# Patient Record
Sex: Male | Born: 1970 | Race: Black or African American | Hispanic: No | Marital: Married | Smoking: Never smoker
Health system: Southern US, Community
[De-identification: ages and names within clinical notes are randomized; demographics above are authoritative.]

## PROBLEM LIST (undated history)

## (undated) DIAGNOSIS — K5792 Diverticulitis of intestine, part unspecified, without perforation or abscess without bleeding: Secondary | ICD-10-CM

## (undated) DIAGNOSIS — I10 Essential (primary) hypertension: Secondary | ICD-10-CM

## (undated) DIAGNOSIS — G43709 Chronic migraine without aura, not intractable, without status migrainosus: Secondary | ICD-10-CM

## (undated) DIAGNOSIS — R011 Cardiac murmur, unspecified: Secondary | ICD-10-CM

## (undated) DIAGNOSIS — G4730 Sleep apnea, unspecified: Secondary | ICD-10-CM

## (undated) DIAGNOSIS — IMO0002 Reserved for concepts with insufficient information to code with codable children: Secondary | ICD-10-CM

## (undated) DIAGNOSIS — E119 Type 2 diabetes mellitus without complications: Secondary | ICD-10-CM

## (undated) DIAGNOSIS — K219 Gastro-esophageal reflux disease without esophagitis: Secondary | ICD-10-CM

## (undated) HISTORY — DX: Reserved for concepts with insufficient information to code with codable children: IMO0002

## (undated) HISTORY — PX: APPENDECTOMY: SHX54

## (undated) HISTORY — DX: Cardiac murmur, unspecified: R01.1

## (undated) HISTORY — DX: Chronic migraine without aura, not intractable, without status migrainosus: G43.709

## (undated) HISTORY — DX: Gastro-esophageal reflux disease without esophagitis: K21.9

---

## 2000-10-12 HISTORY — PX: VASECTOMY: SHX75

## 2005-07-06 ENCOUNTER — Emergency Department: Payer: Self-pay | Admitting: Emergency Medicine

## 2012-08-26 ENCOUNTER — Ambulatory Visit: Payer: Self-pay | Admitting: Surgery

## 2012-08-26 LAB — BASIC METABOLIC PANEL
Anion Gap: 7 (ref 7–16)
BUN: 15 mg/dL (ref 7–18)
Calcium, Total: 9.2 mg/dL (ref 8.5–10.1)
Creatinine: 0.86 mg/dL (ref 0.60–1.30)
EGFR (African American): >60
EGFR (Non-African Amer.): >60
Glucose: 96 mg/dL (ref 65–99)
Osmolality: 275 (ref 275–301)
Potassium: 3.5 mmol/L (ref 3.5–5.1)

## 2012-08-26 LAB — CBC WITH DIFFERENTIAL/PLATELET
Basophil %: 0.5 %
Eosinophil %: 0.7 %
HCT: 39.9 % — ABNORMAL LOW (ref 40.0–52.0)
Lymphocyte #: 2.3 10*3/uL (ref 1.0–3.6)
MCH: 31.2 pg (ref 26.0–34.0)
MCHC: 34.8 g/dL (ref 32.0–36.0)
MCV: 90 fL (ref 80–100)
Monocyte %: 9 %
Neutrophil #: 8.7 10*3/uL — ABNORMAL HIGH (ref 1.4–6.5)
Platelet: 263 10*3/uL (ref 150–440)
RBC: 4.45 10*6/uL (ref 4.40–5.90)
WBC: 12.2 10*3/uL — ABNORMAL HIGH (ref 3.8–10.6)

## 2013-02-24 ENCOUNTER — Ambulatory Visit: Payer: Self-pay | Admitting: Nurse Practitioner

## 2013-05-16 ENCOUNTER — Emergency Department: Payer: Self-pay | Admitting: Emergency Medicine

## 2013-05-16 LAB — CBC
HCT: 40 % (ref 40.0–52.0)
MCHC: 34.7 g/dL (ref 32.0–36.0)
MCV: 88 fL (ref 80–100)
Platelet: 224 10*3/uL (ref 150–440)
RBC: 4.54 10*6/uL (ref 4.40–5.90)
RDW: 13.8 % (ref 11.5–14.5)

## 2013-05-16 LAB — BASIC METABOLIC PANEL
BUN: 14 mg/dL (ref 7–18)
Chloride: 103 mmol/L (ref 98–107)
Co2: 26 mmol/L (ref 21–32)
EGFR (African American): >60
EGFR (Non-African Amer.): >60
Osmolality: 275 (ref 275–301)
Potassium: 3.5 mmol/L (ref 3.5–5.1)
Sodium: 137 mmol/L (ref 136–145)

## 2013-05-16 LAB — TROPONIN I: Troponin-I: <0.02 ng/mL

## 2013-12-06 ENCOUNTER — Ambulatory Visit: Payer: BC Managed Care – PPO | Admitting: Nurse Practitioner

## 2014-03-09 ENCOUNTER — Ambulatory Visit: Payer: BC Managed Care – PPO | Admitting: Neurology

## 2014-03-09 ENCOUNTER — Telehealth: Payer: Self-pay | Admitting: Neurology

## 2014-03-09 NOTE — Telephone Encounter (Signed)
Called and left message for patient about rescheduling today's appointment with Dr. Terrace Arabia that he missed due to getting lost.

## 2014-04-19 ENCOUNTER — Emergency Department: Payer: Self-pay | Admitting: Emergency Medicine

## 2014-04-19 LAB — COMPREHENSIVE METABOLIC PANEL
AST: 27 U/L (ref 15–37)
Albumin: 3.9 g/dL (ref 3.4–5.0)
Alkaline Phosphatase: 68 U/L
Anion Gap: 9 (ref 7–16)
BUN: 17 mg/dL (ref 7–18)
Bilirubin,Total: 0.6 mg/dL (ref 0.2–1.0)
Calcium, Total: 8.9 mg/dL (ref 8.5–10.1)
Chloride: 105 mmol/L (ref 98–107)
Co2: 27 mmol/L (ref 21–32)
Creatinine: 0.9 mg/dL (ref 0.60–1.30)
EGFR (African American): >60
EGFR (Non-African Amer.): >60
Glucose: 103 mg/dL — ABNORMAL HIGH (ref 65–99)
OSMOLALITY: 283 (ref 275–301)
Potassium: 3.7 mmol/L (ref 3.5–5.1)
SGPT (ALT): 40 U/L (ref 12–78)
SODIUM: 141 mmol/L (ref 136–145)
TOTAL PROTEIN: 7.5 g/dL (ref 6.4–8.2)

## 2014-04-19 LAB — CBC
HCT: 41.9 % (ref 40.0–52.0)
HGB: 14 g/dL (ref 13.0–18.0)
MCH: 30.8 pg (ref 26.0–34.0)
MCHC: 33.5 g/dL (ref 32.0–36.0)
MCV: 92 fL (ref 80–100)
PLATELETS: 251 10*3/uL (ref 150–440)
RBC: 4.55 10*6/uL (ref 4.40–5.90)
RDW: 13.2 % (ref 11.5–14.5)
WBC: 7 10*3/uL (ref 3.8–10.6)

## 2014-04-19 LAB — URINALYSIS, COMPLETE
BACTERIA: NONE SEEN
BILIRUBIN, UR: NEGATIVE
BLOOD: NEGATIVE
Glucose,UR: NEGATIVE mg/dL (ref 0–75)
KETONE: NEGATIVE
Leukocyte Esterase: NEGATIVE
Nitrite: NEGATIVE
PROTEIN: NEGATIVE
Ph: 5 (ref 4.5–8.0)
RBC,UR: 2 /HPF (ref 0–5)
Specific Gravity: 1.026 (ref 1.003–1.030)
Squamous Epithelial: NONE SEEN
WBC UR: 1 /HPF (ref 0–5)

## 2014-04-19 LAB — CLOSTRIDIUM DIFFICILE(ARMC)

## 2014-04-19 LAB — LIPASE, BLOOD: Lipase: 209 U/L (ref 73–393)

## 2014-11-28 ENCOUNTER — Emergency Department: Payer: Self-pay | Admitting: Emergency Medicine

## 2014-12-13 ENCOUNTER — Ambulatory Visit: Payer: Self-pay | Admitting: Gastroenterology

## 2014-12-13 HISTORY — PX: COLONOSCOPY: SHX174

## 2015-01-29 NOTE — Op Note (Signed)
PATIENT NAME:  Tyler Colon, Tyler Colon MR#:  [id number] DATE OF BIRTH:  09/05/1971  DATE OF PROCEDURE:  08/26/2012  PREOPERATIVE DIAGNOSIS: Perirectal abscess.   POSTOPERATIVE DIAGNOSIS: Perirectal abscess.   PROCEDURE: Incision and drainage of perirectal abscess.   SURGEON: Dionne Miloichard Nimco Bivens, MD   ANESTHESIA: Spinal.   INDICATIONS: This is a patient with perirectal abscess seen by Dr. Egbert GaribaldiBird in the office yesterday. He is here for elective incision and drainage.   FINDINGS: Small abscess cavity on the right buttock . A Penrose was drain placed.   DESCRIPTION OF PROCEDURE: The patient was induced to spinal anesthesia with an adequate level of anesthesia, and then he was placed in a high lithotomy position with candy cane stirrups, prepped and draped in a sterile fashion. An incision was made on the right buttock at the area of maximum fluctuance and purulence exuded. Cultures were taken. The cavity was probed and found to be quite shallow. A Penrose drain was placed and tied in with 2-0 Prolene and trimmed appropriately. A sterile dressing was placed.   The patient tolerated the procedure well. There were no complications. He was taken to the recovery room in stable condition to be admitted for continued care.   ____________________________ Adah Salvageichard E. Excell Seltzerooper, MD rec:cbb D: 08/26/2012 09:35:37 ET T: 08/26/2012 10:32:33 ET JOB#: [id number]  cc: Adah Salvageichard E. Excell Seltzerooper, MD, <Dictator> Lattie HawICHARD E Jennessy Sandridge MD ELECTRONICALLY SIGNED 08/26/2012 14:34

## 2015-01-29 NOTE — Discharge Summary (Signed)
PATIENT NAME:  Tyler Colon, Tyler Colon MR#:  [id number] DATE OF BIRTH:  December 22, 1970  DATE OF ADMISSION:  08/26/2012 DATE OF DISCHARGE:  08/27/2012  DISCHARGE DIAGNOSIS: Perirectal abscess.   PROCEDURE: Incision and drainage of perirectal abscess.   HISTORY OF PRESENT ILLNESS/HOSPITAL COURSE: This is a patient who was seen in the office by Dr. Egbert GaribaldiBird and scheduled for elective incision and drainage of a perirectal abscess which was performed by me on Friday, the 15th. A Penrose drain was placed. Cultures are pending. He is discharged in stable condition on Keflex and Vicodin to follow up in my office on Monday or Tuesday for Penrose drain removal. He understood all this and agrees with the plan.   ____________________________ Adah Salvageichard E. Excell Seltzerooper, MD rec:cbb D: 08/27/2012 07:55:47 ET T: 08/29/2012 10:27:52 ET JOB#: [id number]  cc: Adah Salvageichard E. Excell Seltzerooper, MD, <Dictator> Lattie HawICHARD E COOPER MD ELECTRONICALLY SIGNED 09/01/2012 13:06

## 2015-04-02 ENCOUNTER — Ambulatory Visit: Payer: Self-pay | Admitting: Family Medicine

## 2015-04-16 DIAGNOSIS — E785 Hyperlipidemia, unspecified: Secondary | ICD-10-CM | POA: Insufficient documentation

## 2015-04-16 DIAGNOSIS — Z0000 Encounter for general adult medical examination without abnormal findings: Secondary | ICD-10-CM | POA: Insufficient documentation

## 2015-04-16 DIAGNOSIS — L03317 Cellulitis of buttock: Secondary | ICD-10-CM | POA: Insufficient documentation

## 2015-04-16 DIAGNOSIS — K611 Rectal abscess: Secondary | ICD-10-CM | POA: Insufficient documentation

## 2015-04-16 DIAGNOSIS — K1120 Sialoadenitis, unspecified: Secondary | ICD-10-CM | POA: Insufficient documentation

## 2015-04-16 DIAGNOSIS — R011 Cardiac murmur, unspecified: Secondary | ICD-10-CM | POA: Insufficient documentation

## 2015-04-16 DIAGNOSIS — R739 Hyperglycemia, unspecified: Secondary | ICD-10-CM | POA: Insufficient documentation

## 2015-04-16 DIAGNOSIS — Z125 Encounter for screening for malignant neoplasm of prostate: Secondary | ICD-10-CM | POA: Insufficient documentation

## 2015-04-16 DIAGNOSIS — R972 Elevated prostate specific antigen [PSA]: Secondary | ICD-10-CM | POA: Insufficient documentation

## 2015-04-16 DIAGNOSIS — H9209 Otalgia, unspecified ear: Secondary | ICD-10-CM | POA: Insufficient documentation

## 2015-04-16 DIAGNOSIS — R945 Abnormal results of liver function studies: Secondary | ICD-10-CM | POA: Insufficient documentation

## 2015-04-16 DIAGNOSIS — R5383 Other fatigue: Secondary | ICD-10-CM | POA: Insufficient documentation

## 2015-04-16 DIAGNOSIS — R109 Unspecified abdominal pain: Secondary | ICD-10-CM | POA: Insufficient documentation

## 2015-04-16 DIAGNOSIS — M549 Dorsalgia, unspecified: Secondary | ICD-10-CM | POA: Insufficient documentation

## 2015-04-16 DIAGNOSIS — G43109 Migraine with aura, not intractable, without status migrainosus: Secondary | ICD-10-CM | POA: Insufficient documentation

## 2015-04-16 DIAGNOSIS — R7989 Other specified abnormal findings of blood chemistry: Secondary | ICD-10-CM | POA: Insufficient documentation

## 2015-04-16 DIAGNOSIS — E291 Testicular hypofunction: Secondary | ICD-10-CM | POA: Insufficient documentation

## 2015-04-16 DIAGNOSIS — G4700 Insomnia, unspecified: Secondary | ICD-10-CM | POA: Insufficient documentation

## 2015-04-16 DIAGNOSIS — Z833 Family history of diabetes mellitus: Secondary | ICD-10-CM | POA: Insufficient documentation

## 2015-04-18 ENCOUNTER — Encounter: Payer: Self-pay | Admitting: Family Medicine

## 2015-04-18 ENCOUNTER — Ambulatory Visit (INDEPENDENT_AMBULATORY_CARE_PROVIDER_SITE_OTHER): Payer: BLUE CROSS/BLUE SHIELD | Admitting: Family Medicine

## 2015-04-18 VITALS — BP 140/84 | HR 82 | Wt 189.6 lb

## 2015-04-18 DIAGNOSIS — G43409 Hemiplegic migraine, not intractable, without status migrainosus: Secondary | ICD-10-CM | POA: Diagnosis not present

## 2015-04-18 DIAGNOSIS — H6121 Impacted cerumen, right ear: Secondary | ICD-10-CM | POA: Insufficient documentation

## 2015-04-18 DIAGNOSIS — R972 Elevated prostate specific antigen [PSA]: Secondary | ICD-10-CM

## 2015-04-18 DIAGNOSIS — R7309 Other abnormal glucose: Secondary | ICD-10-CM | POA: Diagnosis not present

## 2015-04-18 MED ORDER — SUMATRIPTAN SUCCINATE 100 MG PO TABS: 100.0000 mg | ORAL_TABLET | Freq: Every day | ORAL | Status: DC | PRN

## 2015-04-18 MED ORDER — SUMATRIPTAN SUCCINATE 25 MG PO TABS: 25.0000 mg | ORAL_TABLET | ORAL | Status: DC | PRN

## 2015-04-18 NOTE — Progress Notes (Signed)
Name: Tyler Colon   MRN: [id number]    DOB: 01-06-1971   Date:04/18/2015       Progress Note  Subjective  Chief Complaint  Chief Complaint  Patient presents with  . Establish Care  . Medication Management    Migraine  This is a chronic problem. The current episode started more than 1 year ago. Episode frequency: twice a week on average. The pain is located in the left unilateral region. The pain radiates to the face. The pain quality is similar to prior headaches. The quality of the pain is described as sharp. The pain is at a severity of 3/10. The pain is mild. Associated symptoms include dizziness, ear pain, eye pain, photophobia, a visual change and vomiting. Pertinent negatives include no phonophobia. The symptoms are aggravated by bright light. He has tried triptans for the symptoms. The treatment provided moderate relief. His past medical history is significant for migraine headaches and migraines in the family. There is no history of recent head traumas or sinus disease.     Past Medical History  Diagnosis Date  . Heart murmur   . Chronic migraine   . GERD (gastroesophageal reflux disease)     Past Surgical History  Procedure Laterality Date  . Vasectomy  2002  . Appendectomy      Family History  Problem Relation Age of Onset  . Hypertension Mother   . Diabetes Mother   . Cancer Father     Lung cancer  . Diabetes Father   . Heart failure Father   . Hypertension Maternal Aunt   . Cancer Maternal Aunt     Breast Cancer  . Hypertension Maternal Grandmother   . Diabetes Maternal Grandmother   . Cancer Maternal Grandfather     Lung Cancer    History   Social History  . Marital Status: Married    Spouse Name: N/A  . Number of Children: 2  . Years of Education: N/A   Occupational History  . Employed/ Full time [employer]   Social History Main Topics  . Smoking status: Never Smoker   . Smokeless tobacco: Never Used  . Alcohol Use: 0.0 oz/week    0 Standard drinks  or equivalent per week     Comment: Occasional alcohol use  . Drug Use: No  . Sexual Activity: Not on file   Other Topics Concern  . Not on file   Social History Narrative     Current outpatient prescriptions:  .  CIALIS 5 MG tablet, Take 5 mg by mouth daily., Disp: , Rfl: 0 .  omeprazole (PRILOSEC) 10 MG capsule, Take by mouth., Disp: , Rfl:  .  SUMAtriptan (IMITREX) 100 MG tablet, Take 1 tablet (100 mg total) by mouth daily as needed for migraine. May repeat in 2 hours if headache persists or recurs., Disp: 30 tablet, Rfl: 0  No Known Allergies   Review of Systems  HENT: Positive for ear pain.   Eyes: Positive for photophobia and pain.  Gastrointestinal: Positive for vomiting.  Neurological: Positive for dizziness.      Objective  Filed Vitals:   04/18/15 0807  BP: 140/84  Pulse: 82  Weight: 189 lb 9.6 oz (86.002 kg)  SpO2: 97%    Physical Exam  Constitutional: He is well-developed, well-nourished, and in no distress.  HENT:  Head: Normocephalic and atraumatic.  Right Ear: External ear normal.  Left Ear: External ear normal.  Cerumen impaction right ear canal  Eyes: Conjunctivae are normal. Pupils are  equal, round, and reactive to light.  Neck: Normal range of motion. Neck supple.  Cardiovascular: Normal rate and regular rhythm.   Pulmonary/Chest: Effort normal and breath sounds normal.  Nursing note and vitals reviewed.      No results found for this or any previous visit (from the past 2160 hour(s)).   Assessment & Plan 1. Hemiplegic migraine without status migrainosus, not intractable Chronic migraine, responsive to sumatriptan. Refill provided. Patient will follow-up. - SUMAtriptan (IMITREX) 100 MG tablet; Take 1 tablet (100 mg total) by mouth daily as needed for migraine. May repeat in 2 hours if headache persists or recurs.  Dispense: 30 tablet; Refill: 0  2. Elevated PSA Patient was referred to urology by Dr. Marguerite OleaMoffett but never got an  appointment. Last PSA in March 2016 was elevated at 4.5. Patient will be referred to Dr. Vanna ScotlandAshley Brandon for follow-up - Ambulatory referral to Urology  3. Elevated hemoglobin A1c measurement Last A1c was 6.1% in March 2016. We will repeat today. - HgB A1c  4. Right ear impacted cerumen  - Ear Lavage   Branden Shallenberger Asad A. Faylene [facility] 04/18/2015 9:21 AM

## 2015-05-02 ENCOUNTER — Encounter: Payer: Self-pay | Admitting: Family Medicine

## 2015-05-02 ENCOUNTER — Ambulatory Visit (INDEPENDENT_AMBULATORY_CARE_PROVIDER_SITE_OTHER): Payer: BLUE CROSS/BLUE SHIELD | Admitting: Family Medicine

## 2015-05-02 ENCOUNTER — Telehealth: Payer: Self-pay | Admitting: Family Medicine

## 2015-05-02 VITALS — BP 140/77 | HR 76 | Temp 98.1°F | Resp 18 | Ht 67.0 in | Wt 193.4 lb

## 2015-05-02 DIAGNOSIS — R197 Diarrhea, unspecified: Secondary | ICD-10-CM | POA: Insufficient documentation

## 2015-05-02 DIAGNOSIS — K529 Noninfective gastroenteritis and colitis, unspecified: Secondary | ICD-10-CM | POA: Diagnosis not present

## 2015-05-02 MED ORDER — METRONIDAZOLE 500 MG PO TABS: 500.0000 mg | ORAL_TABLET | Freq: Three times a day (TID) | ORAL | Status: DC

## 2015-05-02 MED ORDER — CIPROFLOXACIN HCL 500 MG PO TABS: 500.0000 mg | ORAL_TABLET | Freq: Two times a day (BID) | ORAL | Status: DC

## 2015-05-02 NOTE — Progress Notes (Signed)
Name: Tyler Colon   MRN: [id number]    DOB: Dec 10, 1970   Date:05/02/2015       Progress Note  Subjective  Chief Complaint  Chief Complaint  Patient presents with  . Follow-up    2 wk. to discuss medication prescribed for stress  . Hyperlipidemia  . Abdominal Pain    unable to keep food down    Diarrhea  This is a new problem. The current episode started 1 to 4 weeks ago. The problem occurs 5 to 10 times per day. The problem has been gradually worsening. The stool consistency is described as watery. Associated symptoms include abdominal pain. Pertinent negatives include no chills, fever, sweats or vomiting. The symptoms are aggravated by stress (his mother thinks it may be from stress). He has tried nothing for the symptoms. There is no history of inflammatory bowel disease, irritable bowel syndrome, a recent abdominal surgery or short gut syndrome. Pt. has history of diverticulitis.      Past Medical History  Diagnosis Date  . Heart murmur   . Chronic migraine   . GERD (gastroesophageal reflux disease)     Past Surgical History  Procedure Laterality Date  . Vasectomy  2002  . Appendectomy      Family History  Problem Relation Age of Onset  . Hypertension Mother   . Diabetes Mother   . Cancer Father     Lung cancer  . Diabetes Father   . Heart failure Father   . Hypertension Maternal Aunt   . Cancer Maternal Aunt     Breast Cancer  . Hypertension Maternal Grandmother   . Diabetes Maternal Grandmother   . Cancer Maternal Grandfather     Lung Cancer    History   Social History  . Marital Status: Married    Spouse Name: N/A  . Number of Children: 2  . Years of Education: N/A   Occupational History  . Employed/ Full time [employer]   Social History Main Topics  . Smoking status: Never Smoker   . Smokeless tobacco: Never Used  . Alcohol Use: 0.0 oz/week    0 Standard drinks or equivalent per week     Comment: Occasional alcohol use  . Drug Use: No  . Sexual  Activity: Not on file   Other Topics Concern  . Not on file   Social History Narrative     Current outpatient prescriptions:  .  omeprazole (PRILOSEC) 10 MG capsule, Take by mouth., Disp: , Rfl:  .  SUMAtriptan (IMITREX) 100 MG tablet, Take 1 tablet (100 mg total) by mouth daily as needed for migraine. May repeat in 2 hours if headache persists or recurs., Disp: 30 tablet, Rfl: 0 .  CIALIS 5 MG tablet, Take 5 mg by mouth daily., Disp: , Rfl: 0  No Known Allergies   Review of Systems  Constitutional: Negative for fever and chills.  Gastrointestinal: Positive for abdominal pain and diarrhea. Negative for vomiting.      Objective  Filed Vitals:   05/02/15 0821  BP: 140/77  Pulse: 76  Temp: 98.1 F (36.7 C)  TempSrc: Oral  Resp: 18  Height:  (1.702 m)  Weight: 193 lb 6.4 oz (87.726 kg)  SpO2: 97%    Physical Exam  Constitutional: He is oriented to person, place, and time and well-developed, well-nourished, and in no distress.  Cardiovascular: Normal rate and regular rhythm.   Pulmonary/Chest: Effort normal and breath sounds normal.  Abdominal: Soft. There is tenderness (mild tenderness  to palpation over LLQ, no guarding.).  Neurological: He is alert and oriented to person, place, and time.  Skin: Skin is warm and dry.  Psychiatric: Affect normal.  Nursing note and vitals reviewed.      Assessment & Plan 1. Diarrhea in adult patient We will order appropriate laboratory, imaging, and stool studies for evaluation of worsening persistent non bloody diarrhea, especially given the history of diverticulitis. Start on Cipro and Flagyl and follow-up in one week. - CT Abdomen Pelvis Wo Contrast; Future - CBC w/Diff - Comprehensive metabolic panel - Cdiff NAA+O+P+Stool Culture - ciprofloxacin (CIPRO) 500 MG tablet; Take 1 tablet (500 mg total) by mouth 2 (two) times daily.  Dispense: 14 tablet; Refill: 0 - metroNIDAZOLE (FLAGYL) 500 MG tablet; Take 1 tablet (500 mg  total) by mouth 3 (three) times daily.  Dispense: 21 tablet; Refill: 0   Adrina Armijo Asad A. Faylene [facility] 05/02/2015 8:44 AM

## 2015-05-02 NOTE — Telephone Encounter (Signed)
Patient was seen this morning not feeling well and forgot to get note for work before leaving so he will come back to pick up note at front desk.

## 2015-05-03 ENCOUNTER — Emergency Department: Payer: BLUE CROSS/BLUE SHIELD

## 2015-05-03 ENCOUNTER — Emergency Department
Admission: EM | Admit: 2015-05-03 | Discharge: 2015-05-03 | Disposition: A | Discharge status: Home / Self Care | Payer: BLUE CROSS/BLUE SHIELD | Attending: Emergency Medicine | Admitting: Emergency Medicine

## 2015-05-03 DIAGNOSIS — Z792 Long term (current) use of antibiotics: Secondary | ICD-10-CM | POA: Insufficient documentation

## 2015-05-03 DIAGNOSIS — Z79899 Other long term (current) drug therapy: Secondary | ICD-10-CM | POA: Diagnosis not present

## 2015-05-03 DIAGNOSIS — K529 Noninfective gastroenteritis and colitis, unspecified: Secondary | ICD-10-CM | POA: Diagnosis not present

## 2015-05-03 DIAGNOSIS — R197 Diarrhea, unspecified: Secondary | ICD-10-CM

## 2015-05-03 LAB — COMPREHENSIVE METABOLIC PANEL
ALBUMIN: 4.5 g/dL (ref 3.5–5.0)
ALK PHOS: 56 U/L (ref 38–126)
ALT: 65 U/L — ABNORMAL HIGH (ref 17–63)
AST: 44 U/L — ABNORMAL HIGH (ref 15–41)
Anion gap: 7 (ref 5–15)
BUN: 16 mg/dL (ref 6–20)
CALCIUM: 9.1 mg/dL (ref 8.9–10.3)
CO2: 25 mmol/L (ref 22–32)
Chloride: 105 mmol/L (ref 101–111)
Creatinine, Ser: 0.92 mg/dL (ref 0.61–1.24)
GFR calc Af Amer: >60 mL/min (ref >60)
GFR calc non Af Amer: >60 mL/min (ref >60)
Glucose, Bld: 112 mg/dL — ABNORMAL HIGH (ref 65–99)
Potassium: 4 mmol/L (ref 3.5–5.1)
SODIUM: 137 mmol/L (ref 135–145)
Total Bilirubin: 0.9 mg/dL (ref 0.3–1.2)
Total Protein: 7.8 g/dL (ref 6.5–8.1)

## 2015-05-03 LAB — CBC
HCT: 43.9 % (ref 40.0–52.0)
HEMOGLOBIN: 14.8 g/dL (ref 13.0–18.0)
MCH: 30.1 pg (ref 26.0–34.0)
MCHC: 33.7 g/dL (ref 32.0–36.0)
MCV: 89.1 fL (ref 80.0–100.0)
PLATELETS: 229 10*3/uL (ref 150–440)
RBC: 4.93 MIL/uL (ref 4.40–5.90)
RDW: 13.6 % (ref 11.5–14.5)
WBC: 6.3 10*3/uL (ref 3.8–10.6)

## 2015-05-03 LAB — C DIFFICILE QUICK SCREEN W PCR REFLEX
C DIFFICLE (CDIFF) ANTIGEN: NEGATIVE
C Diff interpretation: NEGATIVE
C Diff toxin: NEGATIVE

## 2015-05-03 MED ORDER — IOHEXOL 300 MG/ML  SOLN
100.0000 mL | Freq: Once | INTRAMUSCULAR | Status: AC | PRN
  Administered 2015-05-03: 100 mL via INTRAVENOUS

## 2015-05-03 MED ORDER — IOHEXOL 240 MG/ML SOLN
25.0000 mL | Freq: Once | INTRAMUSCULAR | Status: AC | PRN
  Administered 2015-05-03: 25 mL via ORAL

## 2015-05-03 NOTE — ED Notes (Signed)
Pt c/o abd pain with nausea and diarrhea for the past 2 weeks was seen by PCP yesterday and placed on cipro and flagyl..states he is feeling worse today

## 2015-05-03 NOTE — Discharge Instructions (Signed)

## 2015-05-03 NOTE — ED Provider Notes (Signed)
[facility] Emergency Department Provider Note  ____________________________________________  Time seen: On arrival  I have reviewed the triage vital signs and the nursing notes.   HISTORY  Chief Complaint Abdominal Pain and Diarrhea    HPI Tyler Colon is a 44 y.o. male who presents with abdominal pain and diarrhea. Reportedly this has been going for several weeks. He saw his primary care physician yesterday who ordered lab tests including stool samples and prescribed Cipro and Flagyl. The patient has taken 1 dose of antibiotics but reports he feels worse today. He denies fevers chills. He reports crampy abdominal pain in the left lower quadrant. He has a history of diverticulitis. There is no blood in the stool. He reports numerous episodes of diarrhea per day     Past Medical History  Diagnosis Date  . Heart murmur   . Chronic migraine   . GERD (gastroesophageal reflux disease)     Patient Active Problem List   Diagnosis Date Noted  . Diarrhea in adult patient 05/02/2015  . Elevated PSA 04/18/2015  . Elevated hemoglobin A1c measurement 04/18/2015  . Right ear impacted cerumen 04/18/2015  . Abdominal pain 04/16/2015  . Abnormal LFTs 04/16/2015  . Back ache 04/16/2015  . Cellulitis of buttock 04/16/2015  . Encounter for general adult medical examination without abnormal findings 04/16/2015  . Dyslipidemia 04/16/2015  . Encounter for screening for malignant neoplasm of prostate 04/16/2015  . Abscess, perirectal 04/16/2015  . Parotiditis 04/16/2015  . Headache, migraine 04/16/2015  . Cannot sleep 04/16/2015  . 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 Blood glucose elevated 04/16/2015  . Undiagnosed cardiac murmurs 04/16/2015  . Fatigue 04/16/2015  . Family history of diabetes mellitus 04/16/2015  . Abnormal prostate specific antigen 04/16/2015  . Ear ache 04/16/2015    Past Surgical History  Procedure Laterality Date  . Vasectomy  2002  . Appendectomy       Current Outpatient Rx  Name  Route  Sig  Dispense  Refill  . CIALIS 5 MG tablet   Oral   Take 5 mg by mouth daily.      0     Dispense as written.   . ciprofloxacin (CIPRO) 500 MG tablet   Oral   Take 1 tablet (500 mg total) by mouth 2 (two) times daily.   14 tablet   0   . metroNIDAZOLE (FLAGYL) 500 MG tablet   Oral   Take 1 tablet (500 mg total) by mouth 3 (three) times daily.   21 tablet   0   . SUMAtriptan (IMITREX) 100 MG tablet   Oral   Take 1 tablet (100 mg total) by mouth daily as needed for migraine. May repeat in 2 hours if headache persists or recurs.   30 tablet   0   . omeprazole (PRILOSEC) 10 MG capsule   Oral   Take by mouth.           Allergies Review of patient's allergies indicates no known allergies.  Family History  Problem Relation Age of Onset  . Hypertension Mother   . Diabetes Mother   . Cancer Father     Lung cancer  . Diabetes Father   . Heart failure Father   . Hypertension Maternal Aunt   . Cancer Maternal Aunt     Breast Cancer  . Hypertension Maternal Grandmother   . Diabetes Maternal Grandmother   . Cancer Maternal Grandfather     Lung Cancer    Social History History  Substance  Use Topics  . Smoking status: Never Smoker   . Smokeless tobacco: Never Used  . Alcohol Use: 0.0 oz/week    0 Standard drinks or equivalent per week     Comment: Occasional alcohol use    Review of Systems  Constitutional: Negative for fever. Eyes: Negative for visual changes. ENT: Negative for sore throat Cardiovascular: Negative for chest pain. Respiratory: Negative for shortness of breath. Gastrointestinal: Positive for abdominal pain and diarrhea, no vomiting Genitourinary: Negative for dysuria. Musculoskeletal: Negative for back pain. Skin: Negative for rash. Neurological: Negative for headaches or focal weakness Psychiatric: No anxiety  10-point ROS otherwise  negative.  ____________________________________________   PHYSICAL EXAM:  VITAL SIGNS: ED Triage Vitals  Enc Vitals Group     BP 05/03/15 0755 130/80 mmHg     Pulse Rate 05/03/15 0755 76     Resp 05/03/15 0755 14     Temp 05/03/15 0755 97.8 F (36.6 C)     Temp Source 05/03/15 0755 Oral     SpO2 05/03/15 0755 92 %     Weight 05/03/15 0755 190 lb (86.183 kg)     Height 05/03/15 0755  (1.651 m)     Head Cir --      Peak Flow --      Pain Score 05/03/15 0756 4     Pain Loc --      Pain Edu? --      Excl. in GC? --      Constitutional: Alert and oriented. Well appearing and in no distress. Eyes: Conjunctivae are normal.  ENT   Head: Normocephalic and atraumatic.   Mouth/Throat: Mucous membranes are moist. Cardiovascular: Normal rate, regular rhythm. Normal and symmetric distal pulses are present in all extremities. No murmurs, rubs, or gallops. Respiratory: Normal respiratory effort without tachypnea nor retractions. Breath sounds are clear and equal bilaterally.  Gastrointestinal: Mild tenderness to palpation in the left lower quadrant. No distention. There is no CVA tenderness. Genitourinary: deferred Musculoskeletal: Nontender with normal range of motion in all extremities. No lower extremity tenderness nor edema. Neurologic:  Normal speech and language. No gross focal neurologic deficits are appreciated. Skin:  Skin is warm, dry and intact. No rash noted. Psychiatric: Mood and affect are normal. Patient exhibits appropriate insight and judgment.  ____________________________________________    LABS (pertinent positives/negatives)  Labs Reviewed  COMPREHENSIVE METABOLIC PANEL - Abnormal; Notable for the following:    Glucose, Bld 112 (*)    AST 44 (*)    ALT 65 (*)    All other components within normal limits  C DIFFICILE QUICK SCAN W PCR REFLEX ([facility] ONLY)  STOOL CULTURE  CBC  OVA + PARASITE EXAM     ____________________________________________   EKG None  ____________________________________________    RADIOLOGY I have personally reviewed any xrays that were ordered on this patient: CT abd/pel shows NAD  ____________________________________________   PROCEDURES  Procedure(s) performed: none  Critical Care performed: none  ____________________________________________   INITIAL IMPRESSION / ASSESSMENT AND PLAN / ED COURSE  Pertinent labs & imaging results that were available during my care of the patient were reviewed by me and considered in my medical decision making (see chart for details).  Patient had a decently formed stool in the emergency department. We will send it for C. difficile and culture. We'll check labs and perhaps obtain a CT abdomen given his pain   Patient's CT abd/pel shows no acute distress. Patient feels well. He will follow up with his PCP.  C-diff negative in the ED. Stool cultures sent for PCP followup. Recommended continuing abx.  ____________________________________________   FINAL CLINICAL IMPRESSION(S) / ED DIAGNOSES  Final diagnoses:  Diarrhea in adult patient  Colitis     Jene Every, MD 05/03/15 1459

## 2015-05-03 NOTE — ED Notes (Signed)
Patient results for c-diff resulted negative.  Dr. Cyril Loosen notified and verbal order received to discontinue isolation precautions.

## 2015-05-05 LAB — STOOL CULTURE: Special Requests: NORMAL

## 2015-05-09 ENCOUNTER — Ambulatory Visit: Payer: BLUE CROSS/BLUE SHIELD | Admitting: Family Medicine

## 2015-05-29 ENCOUNTER — Other Ambulatory Visit: Payer: Self-pay

## 2015-05-29 ENCOUNTER — Emergency Department: Payer: BLUE CROSS/BLUE SHIELD

## 2015-05-29 ENCOUNTER — Emergency Department
Admission: EM | Admit: 2015-05-29 | Discharge: 2015-05-29 | Disposition: A | Discharge status: Home / Self Care | Payer: BLUE CROSS/BLUE SHIELD | Attending: Emergency Medicine | Admitting: Emergency Medicine

## 2015-05-29 ENCOUNTER — Encounter: Payer: Self-pay | Admitting: Emergency Medicine

## 2015-05-29 ENCOUNTER — Ambulatory Visit (INDEPENDENT_AMBULATORY_CARE_PROVIDER_SITE_OTHER): Payer: BLUE CROSS/BLUE SHIELD | Admitting: Family Medicine

## 2015-05-29 ENCOUNTER — Encounter: Payer: Self-pay | Admitting: Family Medicine

## 2015-05-29 VITALS — BP 144/90 | HR 83 | Temp 98.5°F | Resp 18 | Ht 65.0 in | Wt 190.4 lb

## 2015-05-29 DIAGNOSIS — R0789 Other chest pain: Secondary | ICD-10-CM

## 2015-05-29 DIAGNOSIS — R079 Chest pain, unspecified: Secondary | ICD-10-CM | POA: Insufficient documentation

## 2015-05-29 LAB — BASIC METABOLIC PANEL
Anion gap: 8 (ref 5–15)
BUN: 12 mg/dL (ref 6–20)
CO2: 25 mmol/L (ref 22–32)
CREATININE: 0.93 mg/dL (ref 0.61–1.24)
Calcium: 8.8 mg/dL — ABNORMAL LOW (ref 8.9–10.3)
Chloride: 101 mmol/L (ref 101–111)
GFR calc Af Amer: >60 mL/min (ref >60)
Glucose, Bld: 107 mg/dL — ABNORMAL HIGH (ref 65–99)
Potassium: 3.4 mmol/L — ABNORMAL LOW (ref 3.5–5.1)
SODIUM: 134 mmol/L — AB (ref 135–145)

## 2015-05-29 LAB — CBC
HCT: 44.4 % (ref 40.0–52.0)
Hemoglobin: 14.6 g/dL (ref 13.0–18.0)
MCH: 29.2 pg (ref 26.0–34.0)
MCHC: 32.8 g/dL (ref 32.0–36.0)
MCV: 89.1 fL (ref 80.0–100.0)
PLATELETS: 235 10*3/uL (ref 150–440)
RBC: 4.98 MIL/uL (ref 4.40–5.90)
RDW: 13.7 % (ref 11.5–14.5)
WBC: 5.4 10*3/uL (ref 3.8–10.6)

## 2015-05-29 LAB — TROPONIN I: Troponin I: <0.03 ng/mL (ref <0.031)

## 2015-05-29 MED ORDER — ASPIRIN 81 MG PO CHEW
324.0000 mg | CHEWABLE_TABLET | Freq: Once | ORAL | Status: AC
  Administered 2015-05-29: 324 mg via ORAL
  Filled 2015-05-29: qty 4

## 2015-05-29 NOTE — ED Notes (Signed)
AAOx3.  Skin warm and dry.  No SOB/ DOE.  NAD 

## 2015-05-29 NOTE — ED Notes (Signed)
Reports cp x 1 wk.  States it is worse at night when lying down.  Skin w/d, NAD

## 2015-05-29 NOTE — Progress Notes (Signed)
Name: Tyler Colon   MRN: [id number]    DOB: Nov 14, 1970   Date:05/29/2015       Progress Note  Subjective  Chief Complaint  Chief Complaint  Patient presents with  . Acute Visit    chest pain/dizzy    Anxiety Presents for follow-up visit. The problem has been gradually worsening. Symptoms include chest pain, dizziness, excessive worry, irritability, muscle tension, nervous/anxious behavior and shortness of breath. Symptoms occur most days. The severity of symptoms is severe. The symptoms are aggravated by family issues (multiple issues including work stress, worried about his son). The quality of sleep is poor.   Past treatments include nothing.    Past Medical History  Diagnosis Date  . Heart murmur   . Chronic migraine   . GERD (gastroesophageal reflux disease)     Past Surgical History  Procedure Laterality Date  . Vasectomy  2002  . Appendectomy      Family History  Problem Relation Age of Onset  . Hypertension Mother   . Diabetes Mother   . Cancer Father     Lung cancer  . Diabetes Father   . Heart failure Father   . Hypertension Maternal Aunt   . Cancer Maternal Aunt     Breast Cancer  . Hypertension Maternal Grandmother   . Diabetes Maternal Grandmother   . Cancer Maternal Grandfather     Lung Cancer    Social History   Social History  . Marital Status: Married    Spouse Name: N/A  . Number of Children: 2  . Years of Education: N/A   Occupational History  . Employed/ Full time [employer]   Social History Main Topics  . Smoking status: Never Smoker   . Smokeless tobacco: Never Used  . Alcohol Use: 0.0 oz/week    0 Standard drinks or equivalent per week     Comment: Occasional alcohol use  . Drug Use: No  . Sexual Activity: Not on file   Other Topics Concern  . Not on file   Social History Narrative     Current outpatient prescriptions:  .  CIALIS 5 MG tablet, Take 5 mg by mouth daily., Disp: , Rfl: 0 .  ciprofloxacin (CIPRO) 500 MG tablet,  Take 1 tablet (500 mg total) by mouth 2 (two) times daily., Disp: 14 tablet, Rfl: 0 .  metroNIDAZOLE (FLAGYL) 500 MG tablet, Take 1 tablet (500 mg total) by mouth 3 (three) times daily., Disp: 21 tablet, Rfl: 0 .  omeprazole (PRILOSEC) 10 MG capsule, Take by mouth., Disp: , Rfl:  .  SUMAtriptan (IMITREX) 100 MG tablet, Take 1 tablet (100 mg total) by mouth daily as needed for migraine. May repeat in 2 hours if headache persists or recurs., Disp: 30 tablet, Rfl: 0  No Known Allergies   Review of Systems  Constitutional: Positive for irritability.  Respiratory: Positive for shortness of breath.   Cardiovascular: Positive for chest pain.  Neurological: Positive for dizziness.  Psychiatric/Behavioral: The patient is nervous/anxious.      Objective  Filed Vitals:   05/29/15 1044  BP: 144/90  Pulse: 83  Temp: 98.5 F (36.9 C)  TempSrc: Oral  Resp: 18  Height: 5\' 5"  (1.651 m)  Weight: 190 lb 6.4 oz (86.365 kg)  SpO2: 96%    Physical Exam  Constitutional: He is well-developed, well-nourished, and in no distress.  Cardiovascular: Normal rate and regular rhythm.   Pulmonary/Chest: Effort normal. He has wheezes (coarse breath sounds).  Nursing note and vitals reviewed.  Assessment & Plan  1. Other chest pain  Due to complaints of chest pain and dyspnea, patient was asked to proceed to the ER for evaluation and workup. Charge nurse Tammy Sours was informed of patient's impending arrival. Patient refused transportation to the ER via EMS and will ride in his car.   Nahome Bublitz Asad A. Faylene [facility] 05/29/2015 10:58 AM

## 2015-05-29 NOTE — Discharge Instructions (Signed)
You have been seen in the Emergency Department (ED) today for chest pain.  As we have discussed todays test results are normal, but you may require further testing.  Please follow up with Dr. Juliann Pares tomorrow regarding todays emergent visit and your recent symptoms to discuss further management.  Continue to take your regular medications. If you are not doing so already, please also take a daily baby aspirin (81 mg), at least until you follow up with your doctor.  Return to the Emergency Department (ED) if you experience any further chest pain/pressure/tightness, difficulty breathing, or sudden sweating, or other symptoms that concern you.   Chest Pain Observation It is often hard to give a specific diagnosis for the cause of chest pain. Among other possibilities your symptoms might be caused by inadequate oxygen delivery to your heart (angina). Angina that is not treated or evaluated can lead to a heart attack (myocardial infarction) or death. Blood tests, electrocardiograms, and X-rays may have been done to help determine a possible cause of your chest pain. After evaluation and observation, your health care provider has determined that it is unlikely your pain was caused by an unstable condition that requires hospitalization. However, a full evaluation of your pain may need to be completed, with additional diagnostic testing as directed. It is very important to keep your follow-up appointments. Not keeping your follow-up appointments could result in permanent heart damage, disability, or death. If there is any problem keeping your follow-up appointments, you must call your health care provider. HOME CARE INSTRUCTIONS  Due to the slight chance that your pain could be angina, it is important to follow your health care provider's treatment plan and also maintain a healthy lifestyle:  Maintain or work toward achieving a healthy weight.  Stay physically active and exercise regularly.  Decrease your  salt intake.  Eat a balanced, healthy diet. Talk to a dietitian to learn about heart-healthy foods.  Increase your fiber intake by including whole grains, vegetables, fruits, and nuts in your diet.  Avoid situations that cause stress, anger, or depression.  Take medicines as advised by your health care provider. Report any side effects to your health care provider. Do not stop medicines or adjust the dosages on your own.  Quit smoking. Do not use nicotine patches or gum until you check with your health care provider.  Keep your blood pressure, blood sugar, and cholesterol levels within normal limits.  Limit alcohol intake to no more than 1 drink per day for women who are not pregnant and 2 drinks per day for men.  Do not abuse drugs. SEEK IMMEDIATE MEDICAL CARE IF: You have severe chest pain or pressure which may include symptoms such as:  You feel pain or pressure in your arms, neck, jaw, or back.  You have severe back or abdominal pain, feel sick to your stomach (nauseous), or throw up (vomit).  You are sweating profusely.  You are having a fast or irregular heartbeat.  You feel short of breath while at rest.  You notice increasing shortness of breath during rest, sleep, or with activity.  You have chest pain that does not get better after rest or after taking your usual medicine.  You wake from sleep with chest pain.  You are unable to sleep because you cannot breathe.  You develop a frequent cough or you are coughing up blood.  You feel dizzy, faint, or experience extreme fatigue.  You develop severe weakness, dizziness, fainting, or chills. Any of these symptoms  may represent a serious problem that is an emergency. Do not wait to see if the symptoms will go away. Call your local emergency services (911 in the [place] Do not drive yourself to the hospital. MAKE SURE YOU:  Understand these instructions.  Will watch your condition.  Will get help right away if you  are not doing well or get worse. Document Released: 10/31/2010 Document Revised: 10/03/2013 Document Reviewed: 03/30/2013 [facility] Patient Information 2015 [place], [place]. This information is not intended to replace advice given to you by your health care provider. Make sure you discuss any questions you have with your health care provider.

## 2015-05-29 NOTE — ED Provider Notes (Signed)
[facility] Emergency Department Provider Note  ____________________________________________  Time seen: Approximately 1:54 PM  I have reviewed the triage vital signs and the nursing notes.   HISTORY  Chief Complaint Chest Pain    HPI Tyler Colon is a 44 y.o. male history of a previous heart murmur and migraines. This reports that he has insomnia because of working on third shift.  Patient presents today states for the last 2 weeks he's had intermittent feeling of aching across his chest. Described as mild to moderate intensity. He reports no nausea or vomiting. No trouble breathing. He does report he occasionally sweats at night and has been under lots of stress recently due to concerns with his son.  He occasionally feels a little bit nauseated, occasionally feels a little bit short of breath but is not feeling that now. Describes an aching pain across the middle chest comes and goes. Does not worsen with exertion. It seems to be worse at night especially when he is "under stress" while considering things revolving around his son.  No abdominal pain. Occasional loose stools, which she also has been in the finger due to being anxious at times.   Past Medical History  Diagnosis Date  . Heart murmur   . Chronic migraine   . GERD (gastroesophageal reflux disease)     Patient Active Problem List   Diagnosis Date Noted  . Chest pain 05/29/2015  . Diarrhea in adult patient 05/02/2015  . Elevated PSA 04/18/2015  . Elevated hemoglobin A1c measurement 04/18/2015  . Right ear impacted cerumen 04/18/2015  . Abdominal pain 04/16/2015  . Abnormal LFTs 04/16/2015  . Back ache 04/16/2015  . Cellulitis of buttock 04/16/2015  . Encounter for general adult medical examination without abnormal findings 04/16/2015  . Dyslipidemia 04/16/2015  . Encounter for screening for malignant neoplasm of prostate 04/16/2015  . Abscess, perirectal 04/16/2015  . Parotiditis  04/16/2015  . Headache, migraine 04/16/2015  . Cannot sleep 04/16/2015  . 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 Blood glucose elevated 04/16/2015  . Undiagnosed cardiac murmurs 04/16/2015  . Fatigue 04/16/2015  . Family history of diabetes mellitus 04/16/2015  . Abnormal prostate specific antigen 04/16/2015  . Ear ache 04/16/2015    Past Surgical History  Procedure Laterality Date  . Vasectomy  2002  . Appendectomy      Current Outpatient Rx  Name  Route  Sig  Dispense  Refill  . omeprazole (PRILOSEC) 10 MG capsule   Oral   Take 10 mg by mouth daily as needed (for indigestion/heartburn).          . SUMAtriptan (IMITREX) 100 MG tablet   Oral   Take 1 tablet (100 mg total) by mouth daily as needed for migraine. May repeat in 2 hours if headache persists or recurs.   30 tablet   0   . tadalafil (CIALIS) 5 MG tablet   Oral   Take 5 mg by mouth daily as needed for erectile dysfunction.         . ciprofloxacin (CIPRO) 500 MG tablet   Oral   Take 1 tablet (500 mg total) by mouth 2 (two) times daily. Patient not taking: Reported on 05/29/2015   14 tablet   0   . metroNIDAZOLE (FLAGYL) 500 MG tablet   Oral   Take 1 tablet (500 mg total) by mouth 3 (three) times daily. Patient not taking: Reported on 05/29/2015   21 tablet   0     Allergies Review of patient's  allergies indicates no known allergies.  Family History  Problem Relation Age of Onset  . Hypertension Mother   . Diabetes Mother   . Cancer Father     Lung cancer  . Diabetes Father   . Heart failure Father   . Hypertension Maternal Aunt   . Cancer Maternal Aunt     Breast Cancer  . Hypertension Maternal Grandmother   . Diabetes Maternal Grandmother   . Cancer Maternal Grandfather     Lung Cancer    Social History Social History  Substance Use Topics  . Smoking status: Never Smoker   . Smokeless tobacco: Never Used  . Alcohol Use: 0.0 oz/week    0 Standard drinks or equivalent per week     Comment:  Occasional alcohol use   family history of heart disease starting in relatives in their early 21s  Review of Systems Constitutional: No fever/chills Eyes: No visual changes. ENT: No sore throat. Cardiovascular: See history of present illness Respiratory: Denies shortness of breath. Gastrointestinal: No abdominal pain.  No nausea, no vomiting.  Frequent loose stool.  No constipation. Genitourinary: Negative for dysuria. Musculoskeletal: Negative for back pain. Skin: Negative for rash. Neurological: Negative for headaches, focal weakness or numbness.  Feels anxious and under stress.  10-point ROS otherwise negative.  ____________________________________________   PHYSICAL EXAM:  VITAL SIGNS: ED Triage Vitals  Enc Vitals Group     BP 05/29/15 1130 132/76 mmHg     Pulse Rate 05/29/15 1342 61     Resp 05/29/15 1130 18     Temp 05/29/15 1130 98.3 F (36.8 C)     Temp Source 05/29/15 1130 Oral     SpO2 05/29/15 1130 100 %     Weight 05/29/15 1130 189 lb (85.73 kg)     Height 05/29/15 1130  (1.651 m)     Head Cir --      Peak Flow --      Pain Score 05/29/15 1128 4     Pain Loc --      Pain Edu? --      Excl. in GC? --     Constitutional: Alert and oriented. Well appearing and in no acute distress. Very amicable, watching television and intrauterine. States that at the present time he is not having any symptoms. Eyes: Conjunctivae are normal. PERRL. EOMI. Head: Atraumatic. Nose: No congestion/rhinnorhea. Mouth/Throat: Mucous membranes are moist.  Oropharynx non-erythematous. Neck: No stridor.   Cardiovascular: Normal rate, regular rhythm. Grossly normal heart sounds.  Good peripheral circulation. Respiratory: Normal respiratory effort.  No retractions. Lungs CTAB. Gastrointestinal: Soft and nontender. No distention. No abdominal bruits. No CVA tenderness. Musculoskeletal: No lower extremity tenderness nor edema.  No joint effusions. Neurologic:  Normal speech and  language. No gross focal neurologic deficits are appreciated. No gait instability. Skin:  Skin is warm, dry and intact. No rash noted. Psychiatric: Mood and affect are normal. Speech and behavior are normal.  ____________________________________________   LABS (all labs ordered are listed, but only abnormal results are displayed)  Labs Reviewed  BASIC METABOLIC PANEL - Abnormal; Notable for the following:    Sodium 134 (*)    Potassium 3.4 (*)    Glucose, Bld 107 (*)    Calcium 8.8 (*)    All other components within normal limits  CBC  TROPONIN I  TROPONIN I   ____________________________________________  EKG  ED ECG REPORT I, QUALE, MARK, the attending physician, personally viewed and interpreted this ECG.  Date: 05/29/2015  EKG Time: Reviewed at 2 PM Rate: 65 Rhythm: normal sinus rhythm QRS Axis: normal Intervals: normal ST/T Wave abnormalities: normal Conduction Disutrbances: none Narrative Interpretation: unremarkable, compared with the patient's previous EKG from 05/16/2013 there is no change except rate has decreased ____________________________________________  RADIOLOGY  CLINICAL DATA: 44 year old male with chest pain for 1 week, increases when supine. Initial encounter.  EXAM: PORTABLE CHEST - 1 VIEW  COMPARISON: 05/16/2013.  FINDINGS: Portable AP upright view at 1402 hours. Chronic somewhat low lung volumes. Normal cardiac size and mediastinal contours. Visualized tracheal air column is within normal limits. Allowing for portable technique, the lungs are clear. No pneumothorax or pleural effusion.  IMPRESSION: No acute cardiopulmonary abnormality.  ____________________________________________   PROCEDURES  Procedure(s) performed: None  Critical Care performed: No  ____________________________________________   INITIAL IMPRESSION / ASSESSMENT AND PLAN / ED COURSE  Pertinent labs & imaging results that were available during my care  of the patient were reviewed by me and considered in my medical decision making (see chart for details).  Patient's primary complaint is chest tightness. Given his clinical history seems reasonable this may be due to anxiety or stress, but he does also have risk factors for heart disease including patient's age and a family history. He is a nonsmoker. His pain is very atypical of an acute coronary syndrome having come and gone for 2 weeks time and being associated with times when he feels stressed out. His EKG is unchanged without ischemic abnormality. We will send 2 troponins, patient's heart score is low risk. No symptoms suggest dissection,embolism, pneumothorax, or other acute chest syndromes.  ----------------------------------------- 5:23 PM on 05/29/2015 -----------------------------------------  Troponin negative 2. Discussed with Dr. Juliann Pares of cardiology, both of [place] agree patient is appropriate for outpatient follow-up tomorrow. Discussed with the patient, he will see Dr. Juliann Pares in the office tomorrow. Careful return precautions advised. Patient reports pain and symptom free at this time. ____________________________________________   FINAL CLINICAL IMPRESSION(S) / ED DIAGNOSES  Final diagnoses:  Chest pain, unspecified chest pain type      Sharyn Creamer, MD 05/29/15 1723

## 2015-05-30 ENCOUNTER — Ambulatory Visit (INDEPENDENT_AMBULATORY_CARE_PROVIDER_SITE_OTHER): Payer: BLUE CROSS/BLUE SHIELD | Admitting: Family Medicine

## 2015-05-30 ENCOUNTER — Encounter: Payer: Self-pay | Admitting: Family Medicine

## 2015-05-30 VITALS — BP 138/78 | HR 98 | Temp 99.2°F | Resp 18 | Ht 65.0 in | Wt 191.3 lb

## 2015-05-30 DIAGNOSIS — F419 Anxiety disorder, unspecified: Secondary | ICD-10-CM

## 2015-05-30 DIAGNOSIS — I509 Heart failure, unspecified: Secondary | ICD-10-CM | POA: Insufficient documentation

## 2015-05-30 MED ORDER — ALPRAZOLAM 0.25 MG PO TABS: 0.2500 mg | ORAL_TABLET | Freq: Three times a day (TID) | ORAL | Status: DC | PRN

## 2015-05-30 NOTE — Progress Notes (Signed)
Name: Tyler Colon   MRN: [id number]    DOB: Nov 21, 1970   Date:05/30/2015       Progress Note  Subjective  Chief Complaint  Chief Complaint  Patient presents with  . Follow-up    1 day ER    Anxiety Presents for follow-up visit. Symptoms include chest pain (chest tightness), depressed mood, excessive worry, insomnia, irritability, nervous/anxious behavior and shortness of breath. Patient reports no palpitations or suicidal ideas. The symptoms are aggravated by family issues (concerned about his son.).   There is no history of anxiety/panic attacks or depression. Past treatments include nothing.   Patient has been seen and evaluated by [facility] [place] ED for symptoms of chest pain, and shortness of breath. Initial workup in the ER was negative. He has an appointment with cardiologist today.   Past Medical History  Diagnosis Date  . Heart murmur   . Chronic migraine   . GERD (gastroesophageal reflux disease)     Past Surgical History  Procedure Laterality Date  . Vasectomy  2002  . Appendectomy      Family History  Problem Relation Age of Onset  . Hypertension Mother   . Diabetes Mother   . Cancer Father     Lung cancer  . Diabetes Father   . Heart failure Father   . Hypertension Maternal Aunt   . Cancer Maternal Aunt     Breast Cancer  . Hypertension Maternal Grandmother   . Diabetes Maternal Grandmother   . Cancer Maternal Grandfather     Lung Cancer    Social History   Social History  . Marital Status: Married    Spouse Name: N/A  . Number of Children: 2  . Years of Education: N/A   Occupational History  . Employed/ Full time [employer]   Social History Main Topics  . Smoking status: Never Smoker   . Smokeless tobacco: Never Used  . Alcohol Use: 0.0 oz/week    0 Standard drinks or equivalent per week     Comment: Occasional alcohol use  . Drug Use: No  . Sexual Activity: Not on file   Other Topics Concern  . Not on file   Social  History Narrative     Current outpatient prescriptions:  .  ciprofloxacin (CIPRO) 500 MG tablet, Take 1 tablet (500 mg total) by mouth 2 (two) times daily., Disp: 14 tablet, Rfl: 0 .  metroNIDAZOLE (FLAGYL) 500 MG tablet, Take 1 tablet (500 mg total) by mouth 3 (three) times daily., Disp: 21 tablet, Rfl: 0 .  omeprazole (PRILOSEC) 10 MG capsule, Take 10 mg by mouth daily as needed (for indigestion/heartburn). , Disp: , Rfl:  .  SUMAtriptan (IMITREX) 100 MG tablet, Take 1 tablet (100 mg total) by mouth daily as needed for migraine. May repeat in 2 hours if headache persists or recurs., Disp: 30 tablet, Rfl: 0 .  tadalafil (CIALIS) 5 MG tablet, Take 5 mg by mouth daily as needed for erectile dysfunction., Disp: , Rfl:  .  ALPRAZolam (XANAX) 0.25 MG tablet, Take 1 tablet (0.25 mg total) by mouth 3 (three) times daily as needed for anxiety., Disp: 90 tablet, Rfl: 0  No Known Allergies   Review of Systems  Constitutional: Positive for irritability.  Respiratory: Positive for shortness of breath.   Cardiovascular: Positive for chest pain (chest tightness). Negative for palpitations.  Psychiatric/Behavioral: Negative for depression, suicidal ideas and substance abuse. The patient is nervous/anxious and has insomnia.       Objective  Filed Vitals:   05/30/15 1136  BP: 138/78  Pulse: 98  Temp: 99.2 F (37.3 C)  TempSrc: Oral  Resp: 18  Height:  (1.651 m)  Weight: 191 lb 4.8 oz (86.773 kg)  SpO2: 97%    Physical Exam  Constitutional: He is oriented to person, place, and time and well-developed, well-nourished, and in no distress.  HENT:  Head: Normocephalic and atraumatic.  Cardiovascular: Normal rate and regular rhythm.   Pulmonary/Chest: Effort normal and breath sounds normal.  Neurological: He is alert and oriented to person, place, and time.  Psychiatric: Affect and judgment normal. His mood appears anxious. He does not exhibit a depressed mood.  Nursing note and vitals  reviewed.   Assessment & Plan  1. Anxiety Started patient on alprazolam 0.25 mg 3 times a day when necessary for symptoms of anxiety associated with acute stress. Patient educated in detail about the dependence potential of benzodiazepines and their possible interactions with other medications. Patient asked to take medication as directed and report any adverse effects to this provider. Follow-up in one month.  - ALPRAZolam (XANAX) 0.25 MG tablet; Take 1 tablet (0.25 mg total) by mouth 3 (three) times daily as needed for anxiety.  Dispense: 90 tablet; Refill: 0   Qunicy Higinbotham [facility] 05/30/2015 5:38 PM

## 2015-06-04 ENCOUNTER — Telehealth: Payer: Self-pay | Admitting: Family Medicine

## 2015-06-05 ENCOUNTER — Encounter: Payer: Self-pay | Admitting: Family Medicine

## 2015-06-05 ENCOUNTER — Ambulatory Visit (INDEPENDENT_AMBULATORY_CARE_PROVIDER_SITE_OTHER): Payer: BLUE CROSS/BLUE SHIELD | Admitting: Family Medicine

## 2015-06-05 VITALS — BP 139/80 | HR 76 | Temp 98.9°F | Resp 18 | Ht 65.0 in | Wt 191.4 lb

## 2015-06-05 DIAGNOSIS — F419 Anxiety disorder, unspecified: Secondary | ICD-10-CM | POA: Diagnosis not present

## 2015-06-05 MED ORDER — BUSPIRONE HCL 10 MG PO TABS: 10.0000 mg | ORAL_TABLET | Freq: Two times a day (BID) | ORAL | Status: DC

## 2015-06-05 MED ORDER — BUSPIRONE HCL 7.5 MG PO TABS: 7.5000 mg | ORAL_TABLET | Freq: Two times a day (BID) | ORAL | Status: DC

## 2015-06-05 NOTE — Progress Notes (Signed)
Name: Tyler Colon   MRN: [id number]    DOB: [birth year]   Date:06/05/2015       Progress Note  Subjective  Chief Complaint  Chief Complaint  Patient presents with  . Follow-up    Discuss medication  . Anxiety  . Hyperlipidemia    Anxiety Presents for follow-up visit. Symptoms include chest pain, excessive worry, nervous/anxious behavior, palpitations and shortness of breath (Has been seen by Cardiology and has scheduled a stress test.).   Past treatments include benzodiazephines. The treatment provided significant relief. Compliance with prior treatments has been good. Prior compliance problems include medication issues and difficulty with treatment plan (Alprazolam makes him drowsy and he has to leave work if he gets sleep/drowsy. ).  Pt. started on alprazolam 0.25 mg 3 times a day when necessary for anxiety. He gets drowsy and sleepy when he takes it during the day and has to leave work.    Past Medical History  Diagnosis Date  . Heart murmur   . Chronic migraine   . GERD (gastroesophageal reflux disease)     Past Surgical History  Procedure Laterality Date  . Vasectomy  2002  . Appendectomy      Family History  Problem Relation Age of Onset  . Hypertension Mother   . Diabetes Mother   . Cancer Father     Lung cancer  . Diabetes Father   . Heart failure Father   . Hypertension Maternal Aunt   . Cancer Maternal Aunt     Breast Cancer  . Hypertension Maternal Grandmother   . Diabetes Maternal Grandmother   . Cancer Maternal Grandfather     Lung Cancer    Social History   Social History  . Marital Status: Married    Spouse Name: N/A  . Number of Children: 2  . Years of Education: N/A   Occupational History  . Employed/ Full time [employer]   Social History Main Topics  . Smoking status: Never Smoker   . Smokeless tobacco: Never Used  . Alcohol Use: 0.0 oz/week    0 Standard drinks or equivalent per week     Comment: Occasional alcohol use  . Drug Use: No   . Sexual Activity: Not on file   Other Topics Concern  . Not on file   Social History Narrative     Current outpatient prescriptions:  .  ALPRAZolam (XANAX) 0.25 MG tablet, Take 1 tablet (0.25 mg total) by mouth 3 (three) times daily as needed for anxiety., Disp: 90 tablet, Rfl: 0 .  ciprofloxacin (CIPRO) 500 MG tablet, Take 1 tablet (500 mg total) by mouth 2 (two) times daily., Disp: 14 tablet, Rfl: 0 .  metroNIDAZOLE (FLAGYL) 500 MG tablet, Take 1 tablet (500 mg total) by mouth 3 (three) times daily., Disp: 21 tablet, Rfl: 0 .  omeprazole (PRILOSEC) 10 MG capsule, Take 10 mg by mouth daily as needed (for indigestion/heartburn). , Disp: , Rfl:  .  SUMAtriptan (IMITREX) 100 MG tablet, Take 1 tablet (100 mg total) by mouth daily as needed for migraine. May repeat in 2 hours if headache persists or recurs., Disp: 30 tablet, Rfl: 0 .  tadalafil (CIALIS) 5 MG tablet, Take 5 mg by mouth daily as needed for erectile dysfunction., Disp: , Rfl:   No Known Allergies   Review of Systems  Respiratory: Positive for shortness of breath (Has been seen by Cardiology and has scheduled a stress test.).   Cardiovascular: Positive for chest pain and palpitations.  Psychiatric/Behavioral: The  patient is nervous/anxious.       Objective  Filed Vitals:   06/05/15 0942  BP: 139/80  Pulse: 76  Temp: 98.9 F (37.2 C)  TempSrc: Oral  Resp: 18  Height: 5\' 5"  (1.651 m)  Weight: 191 lb 6.4 oz (86.818 kg)  SpO2: 98%    Physical Exam  Constitutional: He is oriented to person, place, and time and well-developed, well-nourished, and in no distress.  Cardiovascular: Normal rate and regular rhythm.   Pulmonary/Chest: Effort normal and breath sounds normal.  Neurological: He is alert and oriented to person, place, and time.  Psychiatric: Affect normal.  Nursing note and vitals reviewed.   Assessment & Plan  1. Anxiety DC alprazolam and start patient on Buspar 7.5 mg twice a day, which should be  increased to 10 mg twice a day after first 2 days. Patient advised to contact us if he experiences any side effects with buspirone. Follow-up in one month.  - busPIRone (BUSPAR) 7.5 MG tablet; Take 1 tablet (7.5 mg total) by mouth 2 (two) times daily.  Dispense: 4 tablet; Refill: 0 - busPIRone (BUSPAR) 10 MG tablet; Take 1 tablet (10 mg total) by mouth 2 (two) times daily.  Dispense: 60 tablet; Refill: 0   Jakobie Henslee [facility] 06/05/2015 9:56 AM

## 2015-06-06 ENCOUNTER — Ambulatory Visit (INDEPENDENT_AMBULATORY_CARE_PROVIDER_SITE_OTHER): Payer: BLUE CROSS/BLUE SHIELD | Admitting: Family Medicine

## 2015-06-06 ENCOUNTER — Encounter: Payer: Self-pay | Admitting: Family Medicine

## 2015-06-06 VITALS — BP 136/70 | HR 110 | Temp 98.4°F | Resp 19 | Ht 65.0 in | Wt 191.4 lb

## 2015-06-06 DIAGNOSIS — J029 Acute pharyngitis, unspecified: Secondary | ICD-10-CM | POA: Diagnosis not present

## 2015-06-06 DIAGNOSIS — G43009 Migraine without aura, not intractable, without status migrainosus: Secondary | ICD-10-CM

## 2015-06-06 DIAGNOSIS — F419 Anxiety disorder, unspecified: Secondary | ICD-10-CM | POA: Diagnosis not present

## 2015-06-06 LAB — POCT RAPID STREP A (OFFICE): Rapid Strep A Screen: NEGATIVE

## 2015-06-06 NOTE — Progress Notes (Signed)
Name: Tyler Colon   MRN: [id number]    DOB: 10/23/1970   Date:06/06/2015       Progress Note  Subjective  Chief Complaint  Chief Complaint  Patient presents with  . Acute Visit    Possible Allergic reaction to medication    Migraine  This is a new problem. The current episode started today. The pain is located in the bilateral region. The quality of the pain is described as sharp. The pain is at a severity of 4/10. Associated symptoms include coughing, ear pain and a sore throat. The symptoms are aggravated by medications (thinks that headache may be related to Buspirone which he started taking yesterday for anxiety.). He has tried nothing for the symptoms. His past medical history is significant for migraine headaches.  Sore Throat  This is a new problem. The current episode started today. There has been no fever. Associated symptoms include coughing, ear pain, headaches and shortness of breath. Pertinent negatives include no ear discharge or hoarse voice. He has tried nothing for the symptoms.     Past Medical History  Diagnosis Date  . Heart murmur   . Chronic migraine   . GERD (gastroesophageal reflux disease)     Past Surgical History  Procedure Laterality Date  . Vasectomy  2002  . Appendectomy      Family History  Problem Relation Age of Onset  . Hypertension Mother   . Diabetes Mother   . Cancer Father     Lung cancer  . Diabetes Father   . Heart failure Father   . Hypertension Maternal Aunt   . Cancer Maternal Aunt     Breast Cancer  . Hypertension Maternal Grandmother   . Diabetes Maternal Grandmother   . Cancer Maternal Grandfather     Lung Cancer    Social History   Social History  . Marital Status: Married    Spouse Name: N/A  . Number of Children: 2  . Years of Education: N/A   Occupational History  . Employed/ Full time [employer]   Social History Main Topics  . Smoking status: Never Smoker   . Smokeless tobacco: Never Used  . Alcohol Use: 0.0  oz/week    0 Standard drinks or equivalent per week     Comment: Occasional alcohol use  . Drug Use: No  . Sexual Activity: Not on file   Other Topics Concern  . Not on file   Social History Narrative     Current outpatient prescriptions:  .  omeprazole (PRILOSEC) 10 MG capsule, Take 10 mg by mouth daily as needed (for indigestion/heartburn). , Disp: , Rfl:  .  SUMAtriptan (IMITREX) 100 MG tablet, Take 1 tablet (100 mg total) by mouth daily as needed for migraine. May repeat in 2 hours if headache persists or recurs., Disp: 30 tablet, Rfl: 0 .  tadalafil (CIALIS) 5 MG tablet, Take 5 mg by mouth daily as needed for erectile dysfunction., Disp: , Rfl:  .  busPIRone (BUSPAR) 7.5 MG tablet, Take 1 tablet (7.5 mg total) by mouth 2 (two) times daily. (Patient not taking: Reported on 06/06/2015), Disp: 4 tablet, Rfl: 0  No Known Allergies   Review of Systems  HENT: Positive for ear pain and sore throat. Negative for ear discharge and hoarse voice.   Respiratory: Positive for cough and shortness of breath.   Cardiovascular: Positive for chest pain (1 episode of chest pain earlier today which resolved. ).  Neurological: Positive for headaches.  Objective  Filed Vitals:   06/06/15 1443  BP: 136/70  Pulse: 110  Temp: 98.4 F (36.9 C)  TempSrc: Oral  Resp: 19  Height: 5\' 5"  (1.651 m)  Weight: 191 lb 6.4 oz (86.818 kg)  SpO2: 96%    Physical Exam  Constitutional: He is oriented to person, place, and time and well-developed, well-nourished, and in no distress.  HENT:  Nose: Right sinus exhibits no maxillary sinus tenderness and no frontal sinus tenderness. Left sinus exhibits no maxillary sinus tenderness and no frontal sinus tenderness.  Mouth/Throat: Posterior oropharyngeal erythema present.  Left ear canal cerumen impaction, unable to visualize the TM.  Cardiovascular: Normal rate and regular rhythm.   Pulmonary/Chest: Effort normal and breath sounds normal.   Lymphadenopathy:       Right cervical: No superficial cervical and no deep cervical adenopathy present.      Left cervical: No superficial cervical and no deep cervical adenopathy present.  Neurological: He is alert and oriented to person, place, and time.  Skin: Skin is warm and dry.  Psychiatric: Affect and judgment normal.  Nursing note and vitals reviewed.   Assessment & Plan  1. Migraine without aura and without status migrainosus, not intractable Acute migraine attack by history and presentation. Patient asked to take OTC Aleve/ibuprofen.  2. Sore throat Rapid strep is negative.  Most likely viral pharyngitis. Patient educated and asked to take conservative measures. He will call if no clinical improvement within 2-3 days. - POCT rapid strep A  3. Anxiety DC buspirone and asked patient to restart alprazolam to be taken in the evenings when he returns from work. Follow-up in one month.    Areona Homer Asad [facility] 06/06/2015 3:04 PM

## 2015-06-11 NOTE — Telephone Encounter (Signed)
ERRENOUS °

## 2015-06-25 ENCOUNTER — Ambulatory Visit: Payer: BLUE CROSS/BLUE SHIELD | Admitting: Family Medicine

## 2015-07-01 ENCOUNTER — Other Ambulatory Visit: Payer: Self-pay | Admitting: Family Medicine

## 2015-07-04 ENCOUNTER — Telehealth: Payer: Self-pay | Admitting: Family Medicine

## 2015-07-04 NOTE — Telephone Encounter (Signed)
Routed to Dr. Shah for advice  

## 2015-07-04 NOTE — Telephone Encounter (Signed)
Patient states someone has called him from this office and did not leave a message. He was requesting a return call. Also he had missed his appointment for earlier this month and did reschedule it for tomorrow. Also would like to let you know that he saw his cardiologist and was put on blood pressure medication. Would like to know if he is able to take the bloodpressure medication along with xanex. Please return call.

## 2015-07-05 ENCOUNTER — Ambulatory Visit: Payer: BLUE CROSS/BLUE SHIELD | Admitting: Family Medicine

## 2015-07-07 NOTE — Telephone Encounter (Signed)
Called and left a voice message for patient. He is due for return office visit from last month's follow-up. Please schedule an appointment

## 2015-07-10 NOTE — Telephone Encounter (Signed)
Dr. Sherryll Burger attempted to call patient and left a voicemail asking patient to return call, no return call as of yet. Dr. Sherryll Burger is requesting patient schedules a follow up appointment i called and left voicemail asking patient to call clinic to schedule follow up appointment on 07/10/2015

## 2015-07-25 ENCOUNTER — Other Ambulatory Visit: Payer: Self-pay | Admitting: Family Medicine

## 2015-07-25 NOTE — Telephone Encounter (Signed)
[facility] is requesting a refill on alprazolam 0.25mg 

## 2015-07-25 NOTE — Telephone Encounter (Signed)
Routed to Dr. Shah for approval 

## 2015-07-26 NOTE — Telephone Encounter (Signed)
Medication refill was refused due to patient needs to schedule appointment per Dr. Sherryll BurgerShah

## 2015-08-23 ENCOUNTER — Ambulatory Visit: Payer: BLUE CROSS/BLUE SHIELD | Admitting: Family Medicine

## 2015-08-26 ENCOUNTER — Ambulatory Visit (INDEPENDENT_AMBULATORY_CARE_PROVIDER_SITE_OTHER): Payer: BLUE CROSS/BLUE SHIELD | Admitting: Family Medicine

## 2015-08-26 ENCOUNTER — Encounter: Payer: Self-pay | Admitting: Family Medicine

## 2015-08-26 VITALS — BP 132/77 | HR 80 | Temp 98.9°F | Resp 17 | Ht 65.0 in | Wt 193.0 lb

## 2015-08-26 DIAGNOSIS — R0981 Nasal congestion: Secondary | ICD-10-CM | POA: Insufficient documentation

## 2015-08-26 DIAGNOSIS — G43009 Migraine without aura, not intractable, without status migrainosus: Secondary | ICD-10-CM | POA: Diagnosis not present

## 2015-08-26 DIAGNOSIS — F419 Anxiety disorder, unspecified: Secondary | ICD-10-CM

## 2015-08-26 MED ORDER — FLUTICASONE PROPIONATE 50 MCG/ACT NA SUSP: 2.0000 | Freq: Every day | NASAL | Status: DC

## 2015-08-26 MED ORDER — ALPRAZOLAM 0.25 MG PO TABS: 0.2500 mg | ORAL_TABLET | Freq: Two times a day (BID) | ORAL | Status: DC | PRN

## 2015-08-26 NOTE — Progress Notes (Signed)
Name: Tyler Colon   MRN: [id number]    DOB: [birth year]   Date:08/26/2015       Progress Note  Subjective  Chief Complaint  Chief Complaint  Patient presents with  . Medication Refill    Anxiety Presents for follow-up visit. Symptoms include insomnia, malaise, nervous/anxious behavior and panic. Patient reports no chest pain or dizziness. The severity of symptoms is moderate. The symptoms are aggravated by work stress.   His past medical history is significant for anxiety/panic attacks. Past treatments include benzodiazephines.  Otalgia  There is pain in the right ear. This is a new problem. The current episode started more than 1 month ago. There has been no fever. Associated symptoms include ear discharge and headaches (Migraine headaches, thinks may be from lack of sleep from anxiety). Pertinent negatives include no hearing loss or sore throat. He has tried nothing for the symptoms. There is no history of a chronic ear infection or hearing loss.  Migraine  This is a chronic problem. The problem has been unchanged. Associated symptoms include ear pain and insomnia. Pertinent negatives include no blurred vision, dizziness, hearing loss or sore throat. The symptoms are aggravated by work. He has tried triptans for the symptoms.    Past Medical History  Diagnosis Date  . Heart murmur   . Chronic migraine   . GERD (gastroesophageal reflux disease)     Past Surgical History  Procedure Laterality Date  . Vasectomy  2002  . Appendectomy      Family History  Problem Relation Age of Onset  . Hypertension Mother   . Diabetes Mother   . Cancer Father     Lung cancer  . Diabetes Father   . Heart failure Father   . Hypertension Maternal Aunt   . Cancer Maternal Aunt     Breast Cancer  . Hypertension Maternal Grandmother   . Diabetes Maternal Grandmother   . Cancer Maternal Grandfather     Lung Cancer    Social History   Social History  . Marital Status: Married    Spouse  Name: N/A  . Number of Children: 2  . Years of Education: N/A   Occupational History  . Employed/ Full time [employer]   Social History Main Topics  . Smoking status: Never Smoker   . Smokeless tobacco: Never Used  . Alcohol Use: 0.0 oz/week    0 Standard drinks or equivalent per week     Comment: Occasional alcohol use  . Drug Use: No  . Sexual Activity: Not on file   Other Topics Concern  . Not on file   Social History Narrative    Current outpatient prescriptions:  .  metoprolol succinate (TOPROL-XL) 25 MG 24 hr tablet, Take by mouth., Disp: , Rfl:  .  omeprazole (PRILOSEC) 10 MG capsule, Take 10 mg by mouth daily as needed (for indigestion/heartburn). , Disp: , Rfl:  .  SUMAtriptan (IMITREX) 100 MG tablet, Take 1 tablet (100 mg total) by mouth daily as needed for migraine. May repeat in 2 hours if headache persists or recurs., Disp: 30 tablet, Rfl: 0 .  tadalafil (CIALIS) 5 MG tablet, Take 5 mg by mouth daily as needed for erectile dysfunction., Disp: , Rfl:   No Known Allergies   Review of Systems  HENT: Positive for congestion, ear discharge and ear pain. Negative for hearing loss and sore throat.   Eyes: Negative for blurred vision and double vision.  Cardiovascular: Negative for chest pain.  Neurological: Positive for  headaches (Migraine headaches, thinks may be from lack of sleep from anxiety). Negative for dizziness.  Psychiatric/Behavioral: Negative for depression. The patient is nervous/anxious and has insomnia.     Objective  Filed Vitals:   08/26/15 0857  BP: 132/77  Pulse: 80  Temp: 98.9 F (37.2 C)  TempSrc: Oral  Resp: 17  Height: 5\' 5"  (1.651 m)  Weight: 193 lb (87.544 kg)  SpO2: 97%    Physical Exam  Constitutional: He is oriented to person, place, and time and well-developed, well-nourished, and in no distress.  HENT:  Head: Normocephalic and atraumatic.  Right Ear: Tympanic membrane normal. No drainage, swelling or tenderness.  Left Ear:  Tympanic membrane and ear canal normal.  Nose: Mucosal edema present. Right sinus exhibits maxillary sinus tenderness. Left sinus exhibits maxillary sinus tenderness.  Right ear canal midly erythematous, no effusion, no drainage. Nasal turbinate hypertrophy  Cardiovascular: Normal rate, regular rhythm, S1 normal, S2 normal and normal heart sounds.   Pulmonary/Chest: Effort normal and breath sounds normal. He has no decreased breath sounds.  Neurological: He is alert and oriented to person, place, and time.  Psychiatric: Mood, memory, affect and judgment normal.  Nursing note and vitals reviewed.   Assessment & Plan  1. Migraine without aura and without status migrainosus, not intractable  Usually takes sumatriptan which helps relieve the headache. Sinusitis may also be a contributing factor.  2. Anxiety  Stable but persistent. Usually from 'stress.' Taking alprazolam as directed and as needed for anxiety. Refills provided. - ALPRAZolam (XANAX) 0.25 MG tablet; Take 1 tablet (0.25 mg total) by mouth 2 (two) times daily as needed for anxiety.  Dispense: 60 tablet; Refill: 0  3. Nasal sinus congestion Nasal turbinate swelling and congestion. Recommended Flonase, to be used for no more than 5-7 days. - fluticasone (FLONASE) 50 MCG/ACT nasal spray; Place 2 sprays into both nostrils daily.  Dispense: 16 g; Refill: 0  Aijah Lattner Asad A. [facility] 08/26/2015 9:36 AM

## 2015-11-06 ENCOUNTER — Ambulatory Visit: Payer: BLUE CROSS/BLUE SHIELD | Admitting: Family Medicine

## 2015-11-18 ENCOUNTER — Ambulatory Visit (INDEPENDENT_AMBULATORY_CARE_PROVIDER_SITE_OTHER): Payer: BLUE CROSS/BLUE SHIELD | Admitting: Family Medicine

## 2015-11-18 ENCOUNTER — Encounter: Payer: Self-pay | Admitting: Family Medicine

## 2015-11-18 VITALS — BP 133/80 | HR 90 | Temp 98.8°F | Resp 20 | Ht 65.0 in | Wt 195.6 lb

## 2015-11-18 DIAGNOSIS — F419 Anxiety disorder, unspecified: Secondary | ICD-10-CM | POA: Diagnosis not present

## 2015-11-18 DIAGNOSIS — G43409 Hemiplegic migraine, not intractable, without status migrainosus: Secondary | ICD-10-CM

## 2015-11-18 DIAGNOSIS — H6120 Impacted cerumen, unspecified ear: Secondary | ICD-10-CM | POA: Insufficient documentation

## 2015-11-18 DIAGNOSIS — H6121 Impacted cerumen, right ear: Secondary | ICD-10-CM

## 2015-11-18 MED ORDER — ALPRAZOLAM 0.5 MG PO TABS: 0.5000 mg | ORAL_TABLET | Freq: Two times a day (BID) | ORAL | Status: DC | PRN

## 2015-11-18 MED ORDER — SUMATRIPTAN SUCCINATE 100 MG PO TABS: 100.0000 mg | ORAL_TABLET | Freq: Every day | ORAL | Status: DC | PRN

## 2015-11-18 NOTE — Progress Notes (Signed)
Name: Tyler Colon   MRN: [id number]    DOB: [birth year]   Date:11/18/2015       Progress Note  Subjective  Chief Complaint  Chief Complaint  Patient presents with  . Follow-up    Headache and fatigue  . Anxiety    Anxiety Presents for follow-up visit. The problem has been unchanged. Symptoms include chest pain, depressed mood, dizziness, excessive worry, malaise, nausea, nervous/anxious behavior and shortness of breath. The severity of symptoms is causing significant distress (feels tired and faigued, stomach stays upset (has to go to the restroom right after he eats), and feels short of breath, having migraine headaches.).   His past medical history is significant for anxiety/panic attacks. There is no history of depression. Past treatments include benzodiazephines. The treatment provided moderate relief. Compliance with prior treatments has been good.  Migraine  This is a chronic problem. The problem has been unchanged. The pain is located in the frontal region. The pain radiates to the face (feels tension in his face.). The quality of the pain is described as sharp. The pain is at a severity of 6/10 (when it commences, it can be 5/10, then goes up). The pain is moderate. Associated symptoms include dizziness, ear pain, nausea, phonophobia and photophobia. Pertinent negatives include no coughing, fever, neck pain, sore throat or tinnitus. The symptoms are aggravated by work. He has tried triptans and beta blockers for the symptoms. The treatment provided moderate relief. His past medical history is significant for migraine headaches.  Otalgia  There is pain in the right ear. This is a chronic problem. The current episode started 1 to 4 weeks ago (3 weeks ago). There has been no fever. The patient is experiencing no pain (feels itchiness in left ear,). Pertinent negatives include no coughing, ear discharge, neck pain or sore throat. Treatments tried: recently treated for bronchitis.    Past  Medical History  Diagnosis Date  . Heart murmur   . Chronic migraine   . GERD (gastroesophageal reflux disease)     Past Surgical History  Procedure Laterality Date  . Vasectomy  2002  . Appendectomy      Family History  Problem Relation Age of Onset  . Hypertension Mother   . Diabetes Mother   . Cancer Father     Lung cancer  . Diabetes Father   . Heart failure Father   . Hypertension Maternal Aunt   . Cancer Maternal Aunt     Breast Cancer  . Hypertension Maternal Grandmother   . Diabetes Maternal Grandmother   . Cancer Maternal Grandfather     Lung Cancer    Social History   Social History  . Marital Status: Married    Spouse Name: N/A  . Number of Children: 2  . Years of Education: N/A   Occupational History  . Employed/ Full time [employer]   Social History Main Topics  . Smoking status: Never Smoker   . Smokeless tobacco: Never Used  . Alcohol Use: 0.0 oz/week    0 Standard drinks or equivalent per week     Comment: Occasional alcohol use  . Drug Use: No  . Sexual Activity: Not on file   Other Topics Concern  . Not on file   Social History Narrative     Current outpatient prescriptions:  .  ALPRAZolam (XANAX) 0.25 MG tablet, Take 1 tablet (0.25 mg total) by mouth 2 (two) times daily as needed for anxiety., Disp: 60 tablet, Rfl: 0 .  metoprolol succinate (  TOPROL-XL) 25 MG 24 hr tablet, Take by mouth., Disp: , Rfl:  .  omeprazole (PRILOSEC) 10 MG capsule, Take 10 mg by mouth daily as needed (for indigestion/heartburn). , Disp: , Rfl:  .  SUMAtriptan (IMITREX) 100 MG tablet, Take 1 tablet (100 mg total) by mouth daily as needed for migraine. May repeat in 2 hours if headache persists or recurs., Disp: 30 tablet, Rfl: 0  No Known Allergies   Review of Systems  Constitutional: Negative for fever and chills.  HENT: Positive for ear pain. Negative for ear discharge, sore throat and tinnitus.   Eyes: Positive for photophobia.  Respiratory: Positive for  shortness of breath. Negative for cough.   Cardiovascular: Positive for chest pain.  Gastrointestinal: Positive for nausea.  Musculoskeletal: Negative for neck pain.  Neurological: Positive for dizziness.  Psychiatric/Behavioral: The patient is nervous/anxious.     Objective  Filed Vitals:   11/18/15 1418  BP: 133/80  Pulse: 90  Temp: 98.8 F (37.1 C)  TempSrc: Oral  Resp: 20  Height:  (1.651 m)  Weight: 195 lb 9.6 oz (88.724 kg)  SpO2: 96%    Physical Exam  Constitutional: He is oriented to person, place, and time and well-developed, well-nourished, and in no distress.  HENT:  Head: Normocephalic and atraumatic.  Left Ear: Tympanic membrane normal.  Right ear canal has cerumen impaction, TM not visualized, left ear canal mildly erythematous, no drainage visible.  Eyes: Pupils are equal, round, and reactive to light.  Cardiovascular: Normal rate and regular rhythm.   Pulmonary/Chest: Effort normal and breath sounds normal.  Abdominal: Soft. Bowel sounds are normal.  Musculoskeletal: Normal range of motion.  Neurological: He is alert and oriented to person, place, and time.  Nursing note and vitals reviewed.   Assessment & Plan  1. Anxiety We will increase Xanax to 0.5 mg twice a day when necessary for optimal treatment of symptoms of anxiety. Recheck in one month. - ALPRAZolam (XANAX) 0.5 MG tablet; Take 1 tablet (0.5 mg total) by mouth 2 (two) times daily as needed for anxiety.  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 Refills provided - SUMAtriptan (IMITREX) 100 MG tablet; Take 1 tablet (100 mg total) by mouth daily as needed for migraine. May repeat in 2 hours if headache persists or recurs.  Dispense: 30 tablet; Refill: 2  3. Cerumen impaction, right  - Ear Lavage   Alvena Kiernan Asad [facility] 11/18/2015 2:27 PM

## 2015-11-20 ENCOUNTER — Telehealth: Payer: Self-pay | Admitting: Family Medicine

## 2015-11-20 NOTE — Telephone Encounter (Signed)
Pt was in the other day and left some FMLA forms and wants to know if Dr Sherryll Burger will fill them out. 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 Please advise pt.

## 2015-11-21 NOTE — Telephone Encounter (Signed)
Dr. Sherryll Burger will complete paperwork and patient will be contacted once paperwork is signed, he has been notified

## 2015-11-27 ENCOUNTER — Ambulatory Visit: Payer: BLUE CROSS/BLUE SHIELD | Admitting: Family Medicine

## 2015-12-16 ENCOUNTER — Ambulatory Visit: Payer: BLUE CROSS/BLUE SHIELD | Admitting: Family Medicine

## 2016-02-11 ENCOUNTER — Ambulatory Visit: Payer: BLUE CROSS/BLUE SHIELD | Attending: Internal Medicine

## 2016-02-11 DIAGNOSIS — G4733 Obstructive sleep apnea (adult) (pediatric): Secondary | ICD-10-CM | POA: Insufficient documentation

## 2016-03-19 ENCOUNTER — Ambulatory Visit: Payer: BLUE CROSS/BLUE SHIELD | Attending: Internal Medicine

## 2016-03-19 DIAGNOSIS — G4733 Obstructive sleep apnea (adult) (pediatric): Secondary | ICD-10-CM | POA: Insufficient documentation

## 2016-08-04 ENCOUNTER — Encounter: Payer: Self-pay | Admitting: Emergency Medicine

## 2016-08-04 ENCOUNTER — Emergency Department
Admission: EM | Admit: 2016-08-04 | Discharge: 2016-08-04 | Disposition: A | Discharge status: Home / Self Care | Payer: BLUE CROSS/BLUE SHIELD | Attending: Emergency Medicine | Admitting: Emergency Medicine

## 2016-08-04 DIAGNOSIS — S0193XA Puncture wound without foreign body of unspecified part of head, initial encounter: Secondary | ICD-10-CM | POA: Diagnosis not present

## 2016-08-04 DIAGNOSIS — Y9389 Activity, other specified: Secondary | ICD-10-CM | POA: Insufficient documentation

## 2016-08-04 DIAGNOSIS — Y929 Unspecified place or not applicable: Secondary | ICD-10-CM | POA: Insufficient documentation

## 2016-08-04 DIAGNOSIS — W228XXA Striking against or struck by other objects, initial encounter: Secondary | ICD-10-CM | POA: Insufficient documentation

## 2016-08-04 DIAGNOSIS — S0990XA Unspecified injury of head, initial encounter: Secondary | ICD-10-CM

## 2016-08-04 DIAGNOSIS — Y999 Unspecified external cause status: Secondary | ICD-10-CM | POA: Diagnosis not present

## 2016-08-04 DIAGNOSIS — Z79899 Other long term (current) drug therapy: Secondary | ICD-10-CM | POA: Insufficient documentation

## 2016-08-04 DIAGNOSIS — IMO0002 Reserved for concepts with insufficient information to code with codable children: Secondary | ICD-10-CM

## 2016-08-04 DIAGNOSIS — T148XXA Other injury of unspecified body region, initial encounter: Secondary | ICD-10-CM

## 2016-08-04 DIAGNOSIS — Z23 Encounter for immunization: Secondary | ICD-10-CM | POA: Insufficient documentation

## 2016-08-04 MED ORDER — TETANUS-DIPHTH-ACELL PERTUSSIS 5-2.5-18.5 LF-MCG/0.5 IM SUSP
0.5000 mL | Freq: Once | INTRAMUSCULAR | Status: AC
Start: 2016-08-04 — End: 2016-08-04
  Administered 2016-08-04: 0.5 mL via INTRAMUSCULAR
  Filled 2016-08-04: qty 0.5

## 2016-08-04 NOTE — ED Provider Notes (Signed)
[facility] Emergency Department Provider Note  ____________________________________________   First MD Initiated Contact with Patient 08/04/16 1044     (approximate)  I have reviewed the triage vital signs and the nursing notes.   HISTORY  Chief Complaint Laceration    HPI Tyler Colon is a 45 y.o. male who presents for evaluation of a minor head injury with a small puncture to the top of his head.  He was helping a friend move some furniture several days ago and raised up beneath a mantle and struck his head on the mantle.  There was a nail that also appears the skin and had a small amount of bleeding.  Bleeding stopped days ago and there is no open wound at this time.  He does not remember the date of his last tetanus vaccination is concerned that he needs another one.  He has had a mild frontal headache starting from the location of the wound and radiating down to his forehead which she describes as mild and nothing particular makes it better nor worse and is an aching pain.  He denies fever/chills, chest pain, shortness of breath, neck pain, nausea, vomiting, abdominal pain.   Past Medical History:  Diagnosis Date  . Chronic migraine   . GERD (gastroesophageal reflux disease)   . Heart murmur     Patient Active Problem List   Diagnosis Date Noted  . Cerumen impaction 11/18/2015  . Nasal sinus congestion 08/26/2015  . Sore throat 06/06/2015  . Anxiety 05/30/2015  . Chest pain 05/29/2015  . Diarrhea in adult patient 05/02/2015  . Elevated PSA 04/18/2015  . Elevated hemoglobin A1c measurement 04/18/2015  . Right ear impacted cerumen 04/18/2015  . Abdominal pain 04/16/2015  . Abnormal LFTs 04/16/2015  . Back ache 04/16/2015  . Encounter for general adult medical examination without abnormal findings 04/16/2015  . Dyslipidemia 04/16/2015  . Encounter for screening for malignant neoplasm of prostate 04/16/2015  . Migraine with aura and without  status migrainosus, not intractable 04/16/2015  . Cannot sleep 04/16/2015  . 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 Blood glucose elevated 04/16/2015  . Undiagnosed cardiac murmurs 04/16/2015  . Fatigue 04/16/2015  . Family history of diabetes mellitus 04/16/2015  . Abnormal prostate specific antigen 04/16/2015  . Ear ache 04/16/2015    Past Surgical History:  Procedure Laterality Date  . APPENDECTOMY    . VASECTOMY  2002    Prior to Admission medications   Medication Sig Start Date End Date Taking? Authorizing Provider  ALPRAZolam Prudy Feeler) 0.5 MG tablet Take 1 tablet (0.5 mg total) by mouth 2 (two) times daily as needed for anxiety. 11/18/15   Ellyn Hack, MD  metoprolol succinate (TOPROL-XL) 25 MG 24 hr tablet Take by mouth. 06/19/15 06/18/16  Historical Provider, MD  omeprazole (PRILOSEC) 10 MG capsule Take 10 mg by mouth daily as needed (for indigestion/heartburn).     Historical Provider, MD  SUMAtriptan (IMITREX) 100 MG tablet Take 1 tablet (100 mg total) by mouth daily as needed for migraine. May repeat in 2 hours if headache persists or recurs. 11/18/15   Ellyn Hack, MD    Allergies Review of patient's allergies indicates no known allergies.  Family History  Problem Relation Age of Onset  . Hypertension Mother   . Diabetes Mother   . Cancer Father     Lung cancer  . Diabetes Father   . Heart failure Father   . Hypertension Maternal Aunt   . Cancer Maternal  Aunt     Breast Cancer  . Hypertension Maternal Grandmother   . Diabetes Maternal Grandmother   . Cancer Maternal Grandfather     Lung Cancer    Social History Social History  Substance Use Topics  . Smoking status: Never Smoker  . Smokeless tobacco: Never Used  . Alcohol use 0.0 oz/week     Comment: Occasional alcohol use    Review of Systems Constitutional: No fever/chills Eyes: No visual changes. Cardiovascular: Denies chest pain. Respiratory: Denies shortness of breath. Gastrointestinal: No abdominal  pain.  No nausea, no vomiting.   Genitourinary: Negative for dysuria. Musculoskeletal: Negative for back pain. Skin: Negative for rash. Small superficial puncture wound on the top of his head Neurological: Mild frontal headache   ____________________________________________   PHYSICAL EXAM:  VITAL SIGNS: ED Triage Vitals  Enc Vitals Group     BP 08/04/16 1025 140/83     Pulse Rate 08/04/16 1025 76     Resp 08/04/16 1025 16     Temp 08/04/16 1025 98.1 F (36.7 C)     Temp Source 08/04/16 1025 Oral     SpO2 08/04/16 1025 97 %     Weight 08/04/16 1025 198 lb (89.8 kg)     Height 08/04/16 1025 5\' 5"  (1.651 m)     Head Circumference --      Peak Flow --      Pain Score 08/04/16 1021 3     Pain Loc --      Pain Edu? --      Excl. in GC? --     Constitutional: Alert and oriented. Well appearing and in no acute distress. Eyes: Conjunctivae are normal. PERRL. EOMI. Head: a very small subacute puncture wound on the top of his head that does not require any repair at this time.  There is a small surrounding hematoma that is mildly tender to palpation Cardiovascular: Normal rate, regular rhythm. Good peripheral circulation.  Respiratory: Normal respiratory effort.  No retractions.  Musculoskeletal: No lower extremity tenderness nor edema. No gross deformities of extremities. Neurologic:  Normal speech and language. No gross focal neurologic deficits are appreciated.  Skin:  Skin is warm, dry and intact Except as described in the head exam above Psychiatric: Mood and affect are normal. Speech and behavior are normal.  ____________________________________________   LABS (all labs ordered are listed, but only abnormal results are displayed)  Labs Reviewed - No data to display ____________________________________________   PROCEDURES  Procedure(s) performed:   Procedures   Critical Care performed: No ____________________________________________   INITIAL IMPRESSION /  ASSESSMENT AND PLAN / ED COURSE  Pertinent labs & imaging results that were available during my care of the patient were reviewed by me and considered in my medical decision making (see chart for details).  Updated tetanus vaccination, gave usual and customary return precautions for mild closed head injury.    ____________________________________________  FINAL CLINICAL IMPRESSION(S) / ED DIAGNOSES  Final diagnoses:  Minor head injury without loss of consciousness, initial encounter  Skin puncture     MEDICATIONS GIVEN DURING THIS VISIT:  Medications  Tdap (BOOSTRIX) injection 0.5 mL (not administered)     NEW OUTPATIENT MEDICATIONS STARTED DURING THIS VISIT:  New Prescriptions   No medications on file    Modified Medications   No medications on file    Discontinued Medications   No medications on file     Note:  This document was prepared using Dragon voice recognition software and may include  unintentional dictation errors.    Loleta Rose, MD 08/04/16 1052

## 2016-08-04 NOTE — ED Notes (Signed)
See triage note  Hit head on mantle with nail  Small puncture wound to froehead  No loc but has had cont's headache

## 2016-08-04 NOTE — ED Triage Notes (Signed)
Hit forehead on a nail yesterday and had small cut, no active bleeding .  Has mild headache.  States he needs tetanus shot.

## 2016-11-16 ENCOUNTER — Encounter: Payer: Self-pay | Admitting: Podiatry

## 2017-01-14 NOTE — Progress Notes (Signed)
This encounter was created in error - please disregard.

## 2017-01-20 DIAGNOSIS — I10 Essential (primary) hypertension: Secondary | ICD-10-CM | POA: Insufficient documentation

## 2017-03-12 DIAGNOSIS — E559 Vitamin D deficiency, unspecified: Secondary | ICD-10-CM | POA: Insufficient documentation

## 2017-03-12 DIAGNOSIS — R7302 Impaired glucose tolerance (oral): Secondary | ICD-10-CM | POA: Insufficient documentation

## 2017-05-02 ENCOUNTER — Emergency Department: Payer: BLUE CROSS/BLUE SHIELD

## 2017-05-02 ENCOUNTER — Encounter: Payer: Self-pay | Admitting: Emergency Medicine

## 2017-05-02 ENCOUNTER — Emergency Department
Admission: EM | Admit: 2017-05-02 | Discharge: 2017-05-02 | Disposition: A | Discharge status: Home / Self Care | Payer: BLUE CROSS/BLUE SHIELD | Attending: Emergency Medicine | Admitting: Emergency Medicine

## 2017-05-02 DIAGNOSIS — K5792 Diverticulitis of intestine, part unspecified, without perforation or abscess without bleeding: Secondary | ICD-10-CM

## 2017-05-02 DIAGNOSIS — K5732 Diverticulitis of large intestine without perforation or abscess without bleeding: Secondary | ICD-10-CM | POA: Insufficient documentation

## 2017-05-02 DIAGNOSIS — R1031 Right lower quadrant pain: Secondary | ICD-10-CM | POA: Insufficient documentation

## 2017-05-02 DIAGNOSIS — R109 Unspecified abdominal pain: Secondary | ICD-10-CM

## 2017-05-02 HISTORY — DX: Diverticulitis of intestine, part unspecified, without perforation or abscess without bleeding: K57.92

## 2017-05-02 LAB — URINALYSIS, COMPLETE (UACMP) WITH MICROSCOPIC
BILIRUBIN URINE: NEGATIVE
Bacteria, UA: NONE SEEN
Glucose, UA: NEGATIVE mg/dL
KETONES UR: NEGATIVE mg/dL
LEUKOCYTES UA: NEGATIVE
Nitrite: NEGATIVE
PH: 5 (ref 5.0–8.0)
Protein, ur: 30 mg/dL — AB
SPECIFIC GRAVITY, URINE: 1.024 (ref 1.005–1.030)

## 2017-05-02 LAB — CBC
HCT: 40.8 % (ref 40.0–52.0)
HEMOGLOBIN: 14.2 g/dL (ref 13.0–18.0)
MCH: 30.8 pg (ref 26.0–34.0)
MCHC: 34.7 g/dL (ref 32.0–36.0)
MCV: 88.8 fL (ref 80.0–100.0)
PLATELETS: 243 10*3/uL (ref 150–440)
RBC: 4.59 MIL/uL (ref 4.40–5.90)
RDW: 13.4 % (ref 11.5–14.5)
WBC: 11.4 10*3/uL — AB (ref 3.8–10.6)

## 2017-05-02 LAB — COMPREHENSIVE METABOLIC PANEL
ALT: 103 U/L — ABNORMAL HIGH (ref 17–63)
ANION GAP: 10 (ref 5–15)
AST: 63 U/L — ABNORMAL HIGH (ref 15–41)
Albumin: 4.5 g/dL (ref 3.5–5.0)
Alkaline Phosphatase: 81 U/L (ref 38–126)
BUN: 14 mg/dL (ref 6–20)
CHLORIDE: 105 mmol/L (ref 101–111)
CO2: 23 mmol/L (ref 22–32)
Calcium: 9.3 mg/dL (ref 8.9–10.3)
Creatinine, Ser: 0.9 mg/dL (ref 0.61–1.24)
Glucose, Bld: 133 mg/dL — ABNORMAL HIGH (ref 65–99)
POTASSIUM: 3.4 mmol/L — AB (ref 3.5–5.1)
SODIUM: 138 mmol/L (ref 135–145)
Total Bilirubin: 0.9 mg/dL (ref 0.3–1.2)
Total Protein: 8.3 g/dL — ABNORMAL HIGH (ref 6.5–8.1)

## 2017-05-02 LAB — LIPASE, BLOOD: LIPASE: 38 U/L (ref 11–51)

## 2017-05-02 MED ORDER — SODIUM CHLORIDE 0.9 % IV BOLUS (SEPSIS)
1000.0000 mL | Freq: Once | INTRAVENOUS | Status: AC
  Administered 2017-05-02: 1000 mL via INTRAVENOUS

## 2017-05-02 MED ORDER — CIPROFLOXACIN HCL 500 MG PO TABS: 500.0000 mg | ORAL_TABLET | Freq: Two times a day (BID) | ORAL | 0 refills | Status: DC

## 2017-05-02 MED ORDER — ONDANSETRON HCL 4 MG/2ML IJ SOLN
4.0000 mg | Freq: Once | INTRAMUSCULAR | Status: AC
  Administered 2017-05-02: 4 mg via INTRAVENOUS
  Filled 2017-05-02: qty 2

## 2017-05-02 MED ORDER — METRONIDAZOLE 500 MG PO TABS
500.0000 mg | ORAL_TABLET | Freq: Once | ORAL | Status: AC
  Administered 2017-05-02: 500 mg via ORAL
  Filled 2017-05-02: qty 1

## 2017-05-02 MED ORDER — HYDROCODONE-ACETAMINOPHEN 5-325 MG PO TABS: 1.0000 | ORAL_TABLET | Freq: Four times a day (QID) | ORAL | 0 refills | Status: DC | PRN

## 2017-05-02 MED ORDER — MORPHINE SULFATE (PF) 4 MG/ML IV SOLN
4.0000 mg | Freq: Once | INTRAVENOUS | Status: AC
  Administered 2017-05-02: 4 mg via INTRAVENOUS
  Filled 2017-05-02: qty 1

## 2017-05-02 MED ORDER — CIPROFLOXACIN HCL 500 MG PO TABS
500.0000 mg | ORAL_TABLET | Freq: Once | ORAL | Status: AC
  Administered 2017-05-02: 500 mg via ORAL
  Filled 2017-05-02: qty 1

## 2017-05-02 MED ORDER — ONDANSETRON HCL 4 MG PO TABS: 4.0000 mg | ORAL_TABLET | Freq: Three times a day (TID) | ORAL | 0 refills | Status: DC | PRN

## 2017-05-02 MED ORDER — METRONIDAZOLE 500 MG PO TABS: 500.0000 mg | ORAL_TABLET | Freq: Two times a day (BID) | ORAL | 0 refills | Status: DC

## 2017-05-02 NOTE — ED Provider Notes (Signed)
[facility] Emergency Department Provider Note ____________________________________________   I have reviewed the triage vital signs and the triage nursing note.  HISTORY  Chief Complaint Abdominal Pain   Historian Patient  HPI Tyler Colon is a 46 y.o. male with history of gerd, diverticulitis, history of appendectomy, presents with lower abdominal and right lower abdominal pain since Thursday after he ate [employer] with a blind had sesame seeds. He feels like this pain is very similar to prior episodes of diverticulitis.  Patient states she's had 3 CT scans of his abdomen in the past.  Denies fever. Positive for nausea without vomiting. Somewhat diarrheal bowel movement on Friday, but none since then.  Pain is moderate and not made worse or better by anything.    Past Medical History:  Diagnosis Date  . Chronic migraine   . Diverticulitis   . GERD (gastroesophageal reflux disease)   . Heart murmur     Patient Active Problem List   Diagnosis Date Noted  . Cerumen impaction 11/18/2015  . Nasal sinus congestion 08/26/2015  . Sore throat 06/06/2015  . Anxiety 05/30/2015  . Chest pain 05/29/2015  . Diarrhea in adult patient 05/02/2015  . Elevated PSA 04/18/2015  . Elevated hemoglobin A1c measurement 04/18/2015  . Right ear impacted cerumen 04/18/2015  . Abdominal pain 04/16/2015  . Abnormal LFTs 04/16/2015  . Back ache 04/16/2015  . Encounter for general adult medical examination without abnormal findings 04/16/2015  . Dyslipidemia 04/16/2015  . Encounter for screening for malignant neoplasm of prostate 04/16/2015  . Migraine with aura and without status migrainosus, not intractable 04/16/2015  . Cannot sleep 04/16/2015  . 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 Blood glucose elevated 04/16/2015  . Undiagnosed cardiac murmurs 04/16/2015  . Fatigue 04/16/2015  . Family history of diabetes mellitus 04/16/2015  . Abnormal prostate specific antigen  04/16/2015  . Ear ache 04/16/2015    Past Surgical History:  Procedure Laterality Date  . APPENDECTOMY    . VASECTOMY  2002    Prior to Admission medications   Medication Sig Start Date End Date Taking? Authorizing Provider  baclofen (LIORESAL) 10 MG tablet Take 10 mg by mouth 2 (two) times daily as needed. 04/27/17  Yes [provider]  Magnesium Oxide 500 MG TABS Take 1 tablet by mouth daily. Take over the counter Magnesium Oxide 400-600 mg daily. If diarrhea is developed, try Magnesium Glycinate 400-600 mg daily. 04/27/17  Yes [provider]  metoprolol succinate (TOPROL-XL) 25 MG 24 hr tablet Take 50 mg by mouth daily.  06/19/15 05/02/17 Yes [provider]  omeprazole (PRILOSEC) 10 MG capsule Take 10 mg by mouth daily as needed (for indigestion/heartburn).    Yes [provider]  Vitamin D, Ergocalciferol, (DRISDOL) 50000 units CAPS capsule Take 50,000 Units by mouth every 7 (seven) days. Take 50,000 Units by mouth once weekly for 16 weeks. 03/25/17  Yes [provider]  ciprofloxacin (CIPRO) 500 MG tablet Take 1 tablet (500 mg total) by mouth 2 (two) times daily. 05/02/17   Governor RooksLord, Nakya Weyand, MD  HYDROcodone-acetaminophen (NORCO/VICODIN) 5-325 MG tablet Take 1 tablet by mouth every 6 (six) hours as needed for moderate pain. 05/02/17   Governor RooksLord, Isadora Delorey, MD  metroNIDAZOLE (FLAGYL) 500 MG tablet Take 1 tablet (500 mg total) by mouth 2 (two) times daily. 05/02/17   Governor RooksLord, Bari Handshoe, MD  ondansetron (ZOFRAN) 4 MG tablet Take 1 tablet (4 mg total) by mouth every 8 (eight) hours as needed for nausea or vomiting. 05/02/17  Governor Rooks, MD  SUMAtriptan (IMITREX) 100 MG tablet Take 1 tablet (100 mg total) by mouth daily as needed for migraine. May repeat in 2 hours if headache persists or recurs. Patient not taking: Reported on 05/02/2017 11/18/15   Ellyn Hack, MD    No Known Allergies  Family History  Problem Relation Age of Onset  . Hypertension Mother   .  Diabetes Mother   . Cancer Father        Lung cancer  . Diabetes Father   . Heart failure Father   . Hypertension Maternal Aunt   . Cancer Maternal Aunt        Breast Cancer  . Hypertension Maternal Grandmother   . Diabetes Maternal Grandmother   . Cancer Maternal Grandfather        Lung Cancer    Social History Social History  Substance Use Topics  . Smoking status: Never Smoker  . Smokeless tobacco: Never Used  . Alcohol use 0.0 oz/week     Comment: Occasional alcohol use    Review of Systems  Constitutional: Negative for fever. Eyes: Negative for visual changes. ENT: Negative for sore throat. Cardiovascular: Negative for chest pain. Respiratory: Negative for shortness of breath. Gastrointestinal: no bloody stools. GI other wise as per history of present illness. Genitourinary: Negative for dysuria. Musculoskeletal: Negative for back pain. Skin: Negative for rash. Neurological: Negative for headache.  ____________________________________________   PHYSICAL EXAM:  VITAL SIGNS: ED Triage Vitals  Enc Vitals Group     BP 05/02/17 0551 (!) 147/95     Pulse Rate 05/02/17 0551 93     Resp 05/02/17 0551 16     Temp 05/02/17 0551 98.6 F (37 C)     Temp Source 05/02/17 0551 Oral     SpO2 05/02/17 0551 98 %     Weight 05/02/17 0541 197 lb (89.4 kg)     Height 05/02/17 0541 5\' 3"  (1.6 m)     Head Circumference --      Peak Flow --      Pain Score 05/02/17 0538 6     Pain Loc --      Pain Edu? --      Excl. in GC? --      Constitutional: Alert and oriented. Well appearing and in no distress. HEENT   Head: Normocephalic and atraumatic.      Eyes: Conjunctivae are normal. Pupils equal and round.       Ears:         Nose: No congestion/rhinnorhea.   Mouth/Throat: Mucous membranes are moist.   Neck: No stridor. Cardiovascular/Chest: Normal rate, regular rhythm.  No murmurs, rubs, or gallops. Respiratory: Normal respiratory effort without tachypnea nor  retractions. Breath sounds are clear and equal bilaterally. No wheezes/rales/rhonchi. Gastrointestinal: Soft. No distention, no guarding, no rebound. Moderate tenderness lower abdomen and rlq.  Genitourinary/rectal:Deferred Musculoskeletal: Nontender with normal range of motion in all extremities. No joint effusions.  No lower extremity tenderness.  No edema. Neurologic:  Normal speech and language. No gross or focal neurologic deficits are appreciated. Skin:  Skin is warm, dry and intact. No rash noted. Psychiatric: Mood and affect are normal. Speech and behavior are normal. Patient exhibits appropriate insight and judgment.   ____________________________________________  LABS (pertinent positives/negatives)  Labs Reviewed  COMPREHENSIVE METABOLIC PANEL - Abnormal; Notable for the following:       Result Value   Potassium 3.4 (*)    Glucose, Bld 133 (*)    Total  Protein 8.3 (*)    AST 63 (*)    ALT 103 (*)    All other components within normal limits  CBC - Abnormal; Notable for the following:    WBC 11.4 (*)    All other components within normal limits  URINALYSIS, COMPLETE (UACMP) WITH MICROSCOPIC - Abnormal; Notable for the following:    Color, Urine YELLOW (*)    APPearance CLEAR (*)    Hgb urine dipstick SMALL (*)    Protein, ur 30 (*)    Squamous Epithelial / LPF 0-5 (*)    All other components within normal limits  LIPASE, BLOOD    ____________________________________________    EKG I, Governor Rooks, MD, the attending physician have personally viewed and interpreted all ECGs.  None ____________________________________________  RADIOLOGY All Xrays were viewed by me. Imaging interpreted by Radiologist.  Abd xr: IMPRESSION: Nonobstructive bowel gas pattern. __________________________________________  PROCEDURES  Procedure(s) performed: None  Critical Care performed: None  ____________________________________________   ED COURSE / ASSESSMENT AND  PLAN  Pertinent labs & imaging results that were available during my care of the patient were reviewed by me and considered in my medical decision making (see chart for details).   Tyler Colon is here with lower abdominal pain especially to the right lower quadrant which feels similar to prior episodes of diverticular colitis.  His laboratory studies do show a slightly elevated white blood cell count, and I think clinically his symptoms probably are due to Dr. Diamantina Monks.  He does not have acute abdomen on exam although he is tender. I'm going to treat him with IV fluids and symptomatic pain medications.  I discussed this with him whether to pursue diagnostic imaging in terms of CAT scan, but he's had several CAT scans over the past few years, and unconcerned that as young as he is with a history of recurrent diverticulitis that if he gets a CT scan every time he has diverticulitis pains he may end up getting too much radiation exposure.  We discussed the risk of radiation exposure. We discussed his clinical scenario and chose to hold off on CT today. Clinically does not seem to have symptoms of toxicity, or complications such as perforation or abscess based on stable vital signs and exam.  We discussed return precautions. We chose to go ahead and treat with Cipro and Flagyl now rather than CT.  Patient symptomatically improved, we will proceed with presented treatment for diverticulitis.    CONSULTATIONS:  None  Patient / Family / Caregiver informed of clinical course, medical decision-making process, and agree with plan.   I discussed return precautions, follow-up instructions, and discharge instructions with patient and/or family.  Discharge Instructions : Although no certain cause was found, your exam and evaluation are clinically suspicious for diverticulitis. We chose to go ahead and treat for presumed diverticulitis without exposing into the radiation of a CT scan today.  Return to  the emergency department immediately for any worsening abdominal pain, black or bloody stool, fever, or any other symptoms concerning to you.  ___________________________________________   FINAL CLINICAL IMPRESSION(S) / ED DIAGNOSES   Final diagnoses:  Right lateral abdominal pain  Diverticulitis              Note: This dictation was prepared with Dragon dictation. Any transcriptional errors that result from this process are unintentional    Governor Rooks, MD 05/02/17 1114

## 2017-05-02 NOTE — Discharge Instructions (Signed)
Although no certain cause was found, your exam and evaluation are clinically suspicious for diverticulitis. We chose to go ahead and treat for presumed diverticulitis without exposing into the radiation of a CT scan today.  Return to the emergency department immediately for any worsening abdominal pain, black or bloody stool, fever, or any other symptoms concerning to you.

## 2017-05-02 NOTE — ED Triage Notes (Addendum)
Pt c/o lower abd pain for 3 days, worse this am; says he had diarrhea when pain started but now can't have a bowel movement-last time he went was Friday; history of diverticulitis;

## 2017-05-06 ENCOUNTER — Other Ambulatory Visit: Payer: Self-pay | Admitting: Neurology

## 2017-05-06 DIAGNOSIS — G43119 Migraine with aura, intractable, without status migrainosus: Secondary | ICD-10-CM

## 2017-05-13 ENCOUNTER — Ambulatory Visit
Admission: RE | Admit: 2017-05-13 | Discharge: 2017-05-13 | Disposition: A | Discharge status: Home / Self Care | Payer: BLUE CROSS/BLUE SHIELD | Source: Ambulatory Visit | Attending: Neurology | Admitting: Neurology

## 2017-05-17 ENCOUNTER — Ambulatory Visit
Admission: RE | Admit: 2017-05-17 | Discharge: 2017-05-17 | Disposition: A | Discharge status: Home / Self Care | Payer: BLUE CROSS/BLUE SHIELD | Source: Ambulatory Visit | Attending: Neurology | Admitting: Neurology

## 2017-05-17 DIAGNOSIS — G43119 Migraine with aura, intractable, without status migrainosus: Secondary | ICD-10-CM

## 2017-08-11 ENCOUNTER — Emergency Department
Admission: EM | Admit: 2017-08-11 | Discharge: 2017-08-11 | Disposition: A | Discharge status: Home / Self Care | Payer: BLUE CROSS/BLUE SHIELD | Attending: Emergency Medicine | Admitting: Emergency Medicine

## 2017-08-11 ENCOUNTER — Emergency Department: Payer: BLUE CROSS/BLUE SHIELD

## 2017-08-11 ENCOUNTER — Encounter: Payer: Self-pay | Admitting: Emergency Medicine

## 2017-08-11 DIAGNOSIS — Z79899 Other long term (current) drug therapy: Secondary | ICD-10-CM | POA: Insufficient documentation

## 2017-08-11 DIAGNOSIS — I10 Essential (primary) hypertension: Secondary | ICD-10-CM | POA: Diagnosis not present

## 2017-08-11 DIAGNOSIS — J40 Bronchitis, not specified as acute or chronic: Secondary | ICD-10-CM | POA: Insufficient documentation

## 2017-08-11 DIAGNOSIS — R079 Chest pain, unspecified: Secondary | ICD-10-CM | POA: Diagnosis present

## 2017-08-11 HISTORY — DX: Essential (primary) hypertension: I10

## 2017-08-11 LAB — BASIC METABOLIC PANEL
Anion gap: 9 (ref 5–15)
BUN: 16 mg/dL (ref 6–20)
CHLORIDE: 104 mmol/L (ref 101–111)
CO2: 26 mmol/L (ref 22–32)
CREATININE: 1 mg/dL (ref 0.61–1.24)
Calcium: 9.1 mg/dL (ref 8.9–10.3)
GFR calc Af Amer: >60 mL/min (ref >60)
GFR calc non Af Amer: >60 mL/min (ref >60)
Glucose, Bld: 99 mg/dL (ref 65–99)
Potassium: 3.4 mmol/L — ABNORMAL LOW (ref 3.5–5.1)
SODIUM: 139 mmol/L (ref 135–145)

## 2017-08-11 LAB — CBC
HCT: 44.1 % (ref 40.0–52.0)
Hemoglobin: 15.2 g/dL (ref 13.0–18.0)
MCH: 30.8 pg (ref 26.0–34.0)
MCHC: 34.5 g/dL (ref 32.0–36.0)
MCV: 89.3 fL (ref 80.0–100.0)
PLATELETS: 249 10*3/uL (ref 150–440)
RBC: 4.93 MIL/uL (ref 4.40–5.90)
RDW: 14.3 % (ref 11.5–14.5)
WBC: 9.2 10*3/uL (ref 3.8–10.6)

## 2017-08-11 LAB — TROPONIN I: Troponin I: <0.03 ng/mL (ref <0.03)

## 2017-08-11 MED ORDER — AZITHROMYCIN 250 MG PO TABS: 250.0000 mg | ORAL_TABLET | Freq: Every day | ORAL | 0 refills | Status: DC

## 2017-08-11 MED ORDER — ACETAMINOPHEN 325 MG PO TABS
650.0000 mg | ORAL_TABLET | Freq: Once | ORAL | Status: AC | PRN
  Administered 2017-08-11: 650 mg via ORAL
  Filled 2017-08-11: qty 2

## 2017-08-11 NOTE — ED Triage Notes (Signed)
Patient presents to ED via POV from home with c/o CP that began yesterday. Patient describes the pain as a pressure. Patient states the pain has gotten progressively worse. Patient denies SOB. Patient reports dizziness. Patient has a cardiologist appointment next week. Patient ambulatory to triage. Patient moaning in pain.

## 2017-08-11 NOTE — ED Notes (Signed)
Pt stating that he has had a bad cough with "cold chills." Pt stating that his chest started hurting earlier today. Pt stating tightness that is worse with deep breathing. Pt denies coughing up any mucous. Pt stating cough has been present since Monday. Pt in NAD at this time. Warm blanket given to pt.

## 2017-08-11 NOTE — ED Provider Notes (Signed)
[facility] Emergency Department Provider Note  Time seen: 8:10 PM  I have reviewed the triage vital signs and the nursing notes.   HISTORY  Chief Complaint Chest Pain    HPI Tyler Colon is a 46 y.o. male with a past medical history of gastric reflux, hypertension, anxiety, presents to the emergency department for upper respiratory type symptoms.  According to the patient for the past 3 days he has been coughing but denies sputum production.  Since yesterday he has been feeling chills with subjective fever.  States a mild sore throat and nasal congestion, mild headache.  Denies any chest pain or abdominal pain except in the upper abdomen when coughing only.  Past Medical History:  Diagnosis Date  . Chronic migraine   . Diverticulitis   . GERD (gastroesophageal reflux disease)   . Heart murmur   . Hypertension     Patient Active Problem List   Diagnosis Date Noted  . Cerumen impaction 11/18/2015  . Nasal sinus congestion 08/26/2015  . Sore throat 06/06/2015  . Anxiety 05/30/2015  . Chest pain 05/29/2015  . Diarrhea in adult patient 05/02/2015  . Elevated PSA 04/18/2015  . Elevated hemoglobin A1c measurement 04/18/2015  . Right ear impacted cerumen 04/18/2015  . Abdominal pain 04/16/2015  . Abnormal LFTs 04/16/2015  . Back ache 04/16/2015  . Encounter for general adult medical examination without abnormal findings 04/16/2015  . Dyslipidemia 04/16/2015  . Encounter for screening for malignant neoplasm of prostate 04/16/2015  . Migraine with aura and without status migrainosus, not intractable 04/16/2015  . Cannot sleep 04/16/2015  . 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 Blood glucose elevated 04/16/2015  . Undiagnosed cardiac murmurs 04/16/2015  . Fatigue 04/16/2015  . Family history of diabetes mellitus 04/16/2015  . Abnormal prostate specific antigen 04/16/2015  . Ear ache 04/16/2015    Past Surgical History:  Procedure Laterality Date  .  APPENDECTOMY    . VASECTOMY  2002    Prior to Admission medications   Medication Sig Start Date End Date Taking? Authorizing Provider  baclofen (LIORESAL) 10 MG tablet Take 10 mg by mouth 2 (two) times daily as needed. 04/27/17   [provider]  ciprofloxacin (CIPRO) 500 MG tablet Take 1 tablet (500 mg total) by mouth 2 (two) times daily. 05/02/17   Governor Rooks, MD  HYDROcodone-acetaminophen (NORCO/VICODIN) 5-325 MG tablet Take 1 tablet by mouth every 6 (six) hours as needed for moderate pain. 05/02/17   Governor Rooks, MD  Magnesium Oxide 500 MG TABS Take 1 tablet by mouth daily. Take over the counter Magnesium Oxide 400-600 mg daily. If diarrhea is developed, try Magnesium Glycinate 400-600 mg daily. 04/27/17   [provider]  metoprolol succinate (TOPROL-XL) 25 MG 24 hr tablet Take 50 mg by mouth daily.  06/19/15 05/02/17  [provider]  metroNIDAZOLE (FLAGYL) 500 MG tablet Take 1 tablet (500 mg total) by mouth 2 (two) times daily. 05/02/17   Governor Rooks, MD  omeprazole (PRILOSEC) 10 MG capsule Take 10 mg by mouth daily as needed (for indigestion/heartburn).     [provider]  ondansetron (ZOFRAN) 4 MG tablet Take 1 tablet (4 mg total) by mouth every 8 (eight) hours as needed for nausea or vomiting. 05/02/17   Governor Rooks, MD  SUMAtriptan (IMITREX) 100 MG tablet Take 1 tablet (100 mg total) by mouth daily as needed for migraine. May repeat in 2 hours if headache persists or recurs. Patient not taking: Reported on 05/02/2017 11/18/15  Ellyn Hack, MD  Vitamin D, Ergocalciferol, (DRISDOL) 50000 units CAPS capsule Take 50,000 Units by mouth every 7 (seven) days. Take 50,000 Units by mouth once weekly for 16 weeks. 03/25/17   [provider]    No Known Allergies  Family History  Problem Relation Age of Onset  . Hypertension Mother   . Diabetes Mother   . Cancer Father        Lung cancer  . Diabetes Father   . Heart failure Father   .  Hypertension Maternal Aunt   . Cancer Maternal Aunt        Breast Cancer  . Hypertension Maternal Grandmother   . Diabetes Maternal Grandmother   . Cancer Maternal Grandfather        Lung Cancer    Social History Social History  Substance Use Topics  . Smoking status: Never Smoker  . Smokeless tobacco: Never Used  . Alcohol use 0.0 oz/week     Comment: Occasional alcohol use    Review of Systems Constitutional: Subjective fever/chills ENT: Positive for congestion Cardiovascular: Negative for chest pain. Respiratory: Negative for shortness of breath.  Positive for cough, negative for sputum production Gastrointestinal: Negative for abdominal pain, diarrhea times 2 days. Genitourinary: Negative for dysuria. Neurological: Mild headache All other ROS negative  ____________________________________________   PHYSICAL EXAM:  VITAL SIGNS: ED Triage Vitals  Enc Vitals Group     BP 08/11/17 1816 (!) 167/78     Pulse Rate 08/11/17 1816 98     Resp 08/11/17 1816 16     Temp 08/11/17 1816 (!) 100.8 F (38.2 C)     Temp Source 08/11/17 1816 Oral     SpO2 08/11/17 1816 100 %     Weight 08/11/17 1816 190 lb (86.2 kg)     Height 08/11/17 1816 5\' 3"  (1.6 m)     Head Circumference --      Peak Flow --      Pain Score 08/11/17 1945 5     Pain Loc --      Pain Edu? --      Excl. in GC? --     Constitutional: Alert and oriented. Well appearing and in no distress. Eyes: Normal exam ENT   Head: Normocephalic and atraumatic   Mouth/Throat: Mucous membranes are moist.  Slight pharyngeal erythema without exudate or tonsillar hypertrophy.  Mild ear cervical lymphadenopathy Cardiovascular: Normal rate, regular rhythm. No murmur Respiratory: Normal respiratory effort without tachypnea nor retractions. Breath sounds are clear.  Occasional dry sounding cough during exam. Gastrointestinal: Soft and nontender. No distention.  Musculoskeletal: Nontender with normal range of motion in  all extremities.  Neurologic:  Normal speech and language. No gross focal neurologic deficits Skin:  Skin is warm, dry and intact.  Psychiatric: Mood and affect are normal.  ____________________________________________    EKG  EKG reviewed and interpreted by myself shows sinus tachycardia 101 bpm with a narrow QRS, left axis deviation, normal intervals, nonspecific but no concerning ST changes.    ____________________________________________    RADIOLOGY  X-ray shows peribronchial thickening.  No pneumonia.  ____________________________________________   INITIAL IMPRESSION / ASSESSMENT AND PLAN / ED COURSE  Pertinent labs & imaging results that were available during my care of the patient were reviewed by me and considered in my medical decision making (see chart for details).  Since the emergency department for upper respiratory symptoms cough and fever.  Differential would include bronchitis, pneumonia, URI, viral process such as influenza.  Overall the patient appears well but does have a frequent dry cough, low-grade temperature 100.8 in the emergency department.  Labs are largely within normal limits including white blood cell count and troponin.  Suspect likely upper respiratory infection/bronchitis.  Given the fever and history of bronchitis in the past we will cover with Zithromax.  I discussed supportive care at home including Tylenol/ibuprofen and plenty of fluids.  I reviewed the patient's records which are largely noncontributory to today's visit.  ____________________________________________   FINAL CLINICAL IMPRESSION(S) / ED DIAGNOSES  Upper respiratory infection Acute bronchitis   Minna AntisPaduchowski, Wayland Baik, MD 08/11/17 2014

## 2018-04-04 ENCOUNTER — Encounter: Payer: Self-pay | Admitting: Emergency Medicine

## 2018-04-04 ENCOUNTER — Emergency Department: Payer: BLUE CROSS/BLUE SHIELD

## 2018-04-04 ENCOUNTER — Emergency Department
Admission: EM | Admit: 2018-04-04 | Discharge: 2018-04-04 | Disposition: A | Discharge status: Home / Self Care | Payer: BLUE CROSS/BLUE SHIELD | Attending: Emergency Medicine | Admitting: Emergency Medicine

## 2018-04-04 DIAGNOSIS — I10 Essential (primary) hypertension: Secondary | ICD-10-CM | POA: Insufficient documentation

## 2018-04-04 DIAGNOSIS — R1030 Lower abdominal pain, unspecified: Secondary | ICD-10-CM | POA: Diagnosis present

## 2018-04-04 DIAGNOSIS — K5790 Diverticulosis of intestine, part unspecified, without perforation or abscess without bleeding: Secondary | ICD-10-CM | POA: Diagnosis not present

## 2018-04-04 DIAGNOSIS — K5792 Diverticulitis of intestine, part unspecified, without perforation or abscess without bleeding: Secondary | ICD-10-CM

## 2018-04-04 LAB — COMPREHENSIVE METABOLIC PANEL
ALBUMIN: 4.2 g/dL (ref 3.5–5.0)
ALT: 39 U/L (ref 17–63)
AST: 26 U/L (ref 15–41)
Alkaline Phosphatase: 42 U/L (ref 38–126)
Anion gap: 8 (ref 5–15)
BILIRUBIN TOTAL: 1.2 mg/dL (ref 0.3–1.2)
BUN: 17 mg/dL (ref 6–20)
CHLORIDE: 101 mmol/L (ref 101–111)
CO2: 27 mmol/L (ref 22–32)
Calcium: 8.9 mg/dL (ref 8.9–10.3)
Creatinine, Ser: 0.81 mg/dL (ref 0.61–1.24)
GFR calc Af Amer: >60 mL/min (ref >60)
GFR calc non Af Amer: >60 mL/min (ref >60)
GLUCOSE: 121 mg/dL — AB (ref 65–99)
POTASSIUM: 3.6 mmol/L (ref 3.5–5.1)
SODIUM: 136 mmol/L (ref 135–145)
TOTAL PROTEIN: 7.6 g/dL (ref 6.5–8.1)

## 2018-04-04 LAB — URINALYSIS, COMPLETE (UACMP) WITH MICROSCOPIC
BACTERIA UA: NONE SEEN
BILIRUBIN URINE: NEGATIVE
Glucose, UA: NEGATIVE mg/dL
Hgb urine dipstick: NEGATIVE
Ketones, ur: NEGATIVE mg/dL
Leukocytes, UA: NEGATIVE
NITRITE: NEGATIVE
PH: 5 (ref 5.0–8.0)
Protein, ur: NEGATIVE mg/dL
SPECIFIC GRAVITY, URINE: 1.024 (ref 1.005–1.030)

## 2018-04-04 LAB — CBC
HEMATOCRIT: 40.9 % (ref 40.0–52.0)
Hemoglobin: 14.4 g/dL (ref 13.0–18.0)
MCH: 31.2 pg (ref 26.0–34.0)
MCHC: 35.2 g/dL (ref 32.0–36.0)
MCV: 88.9 fL (ref 80.0–100.0)
Platelets: 274 10*3/uL (ref 150–440)
RBC: 4.61 MIL/uL (ref 4.40–5.90)
RDW: 13.5 % (ref 11.5–14.5)
WBC: 8.4 10*3/uL (ref 3.8–10.6)

## 2018-04-04 LAB — LIPASE, BLOOD: Lipase: 38 U/L (ref 11–51)

## 2018-04-04 MED ORDER — ONDANSETRON HCL 4 MG/2ML IJ SOLN
4.0000 mg | Freq: Once | INTRAMUSCULAR | Status: AC
  Administered 2018-04-04: 4 mg via INTRAVENOUS
  Filled 2018-04-04: qty 2

## 2018-04-04 MED ORDER — MORPHINE SULFATE (PF) 4 MG/ML IV SOLN
4.0000 mg | Freq: Once | INTRAVENOUS | Status: AC
  Administered 2018-04-04: 4 mg via INTRAVENOUS
  Filled 2018-04-04: qty 1

## 2018-04-04 MED ORDER — IOPAMIDOL (ISOVUE-300) INJECTION 61%
100.0000 mL | Freq: Once | INTRAVENOUS | Status: AC | PRN
  Administered 2018-04-04: 100 mL via INTRAVENOUS

## 2018-04-04 MED ORDER — OXYCODONE-ACETAMINOPHEN 5-325 MG PO TABS: 1.0000 | ORAL_TABLET | Freq: Three times a day (TID) | ORAL | 0 refills | Status: DC | PRN

## 2018-04-04 MED ORDER — METRONIDAZOLE 500 MG PO TABS: 500.0000 mg | ORAL_TABLET | Freq: Three times a day (TID) | ORAL | 0 refills | Status: DC

## 2018-04-04 MED ORDER — ONDANSETRON 4 MG PO TBDP: 4.0000 mg | ORAL_TABLET | Freq: Three times a day (TID) | ORAL | 0 refills | Status: DC | PRN

## 2018-04-04 MED ORDER — OXYCODONE-ACETAMINOPHEN 5-325 MG PO TABS
2.0000 | ORAL_TABLET | Freq: Once | ORAL | Status: AC
Start: 2018-04-04 — End: 2018-04-04
  Administered 2018-04-04: 2 via ORAL
  Filled 2018-04-04: qty 2

## 2018-04-04 MED ORDER — CIPROFLOXACIN HCL 500 MG PO TABS: 500.0000 mg | ORAL_TABLET | Freq: Two times a day (BID) | ORAL | 0 refills | Status: AC

## 2018-04-04 MED ORDER — SODIUM CHLORIDE 0.9 % IV SOLN
Freq: Once | INTRAVENOUS | Status: AC
  Administered 2018-04-04: 10:00:00 via INTRAVENOUS

## 2018-04-04 NOTE — ED Triage Notes (Signed)
Pt reports abdominal pain for the last few days. Reports hx of diverticulitis.

## 2018-04-04 NOTE — ED Provider Notes (Signed)
[facility] Emergency Department Provider Note       Time seen: ----------------------------------------- 9:18 AM on 04/04/2018 -----------------------------------------   I have reviewed the triage vital signs and the nursing notes.  HISTORY   Chief Complaint Abdominal Pain    HPI Tyler Colon is a 47 y.o. male with a history of chronic migraine, diverticulitis, GERD, hypertension who presents to the ED for abdominal pain for the past few days with a history of diverticulitis.  Patient is having diffuse lower abdominal pain that began last night.  Patient states the pain is progressively worsened throughout this morning with associated nausea.  He denies fevers, chills or other complaints.    Past Medical History:  Diagnosis Date  . Chronic migraine   . Diverticulitis   . GERD (gastroesophageal reflux disease)   . Heart murmur   . Hypertension     Patient Active Problem List   Diagnosis Date Noted  . Cerumen impaction 11/18/2015  . Nasal sinus congestion 08/26/2015  . Sore throat 06/06/2015  . Anxiety 05/30/2015  . Chest pain 05/29/2015  . Diarrhea in adult patient 05/02/2015  . Elevated PSA 04/18/2015  . Elevated hemoglobin A1c measurement 04/18/2015  . Right ear impacted cerumen 04/18/2015  . Abdominal pain 04/16/2015  . Abnormal LFTs 04/16/2015  . Back ache 04/16/2015  . Encounter for general adult medical examination without abnormal findings 04/16/2015  . Dyslipidemia 04/16/2015  . Encounter for screening for malignant neoplasm of prostate 04/16/2015  . Migraine with aura and without status migrainosus, not intractable 04/16/2015  . Cannot sleep 04/16/2015  . 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 Blood glucose elevated 04/16/2015  . Undiagnosed cardiac murmurs 04/16/2015  . Fatigue 04/16/2015  . Family history of diabetes mellitus 04/16/2015  . Abnormal prostate specific antigen 04/16/2015  . Ear ache 04/16/2015    Past Surgical History:   Procedure Laterality Date  . APPENDECTOMY    . VASECTOMY  2002    Allergies Patient has no known allergies.  Social History Social History   Tobacco Use  . Smoking status: Never Smoker  . Smokeless tobacco: Never Used  Substance Use Topics  . Alcohol use: Yes    Alcohol/week: 0.0 oz    Comment: Occasional alcohol use  . Drug use: No   Review of Systems Constitutional: Negative for fever. Cardiovascular: Negative for chest pain. Respiratory: Negative for shortness of breath. Gastrointestinal: Positive for abdominal pain, nausea Musculoskeletal: Negative for back pain. Skin: Negative for rash. Neurological: Negative for headaches, focal weakness or numbness.  All systems negative/normal/unremarkable except as stated in the HPI  ____________________________________________   PHYSICAL EXAM:  VITAL SIGNS: ED Triage Vitals  Enc Vitals Group     BP 04/04/18 0909 138/79     Pulse Rate 04/04/18 0909 69     Resp 04/04/18 0909 18     Temp 04/04/18 0909 98.8 F (37.1 C)     Temp Source 04/04/18 0909 Oral     SpO2 04/04/18 0909 97 %     Weight 04/04/18 0907 190 lb (86.2 kg)     Height 04/04/18 0907 5\' 5"  (1.651 m)     Head Circumference --      Peak Flow --      Pain Score 04/04/18 0906 6     Pain Loc --      Pain Edu? --      Excl. in GC? --    Constitutional: Alert and oriented.  Mild distress from pain Eyes: Conjunctivae are normal. Normal  extraocular movements. ENT   Head: Normocephalic and atraumatic.   Nose: No congestion/rhinnorhea.   Mouth/Throat: Mucous membranes are moist.   Neck: No stridor. Cardiovascular: Normal rate, regular rhythm. No murmurs, rubs, or gallops. Respiratory: Normal respiratory effort without tachypnea nor retractions. Breath sounds are clear and equal bilaterally. No wheezes/rales/rhonchi. Gastrointestinal: Diffuse lower abdominal tenderness, particularly in the left lower quadrant, no rebound or guarding.  Normal bowel  sounds. Musculoskeletal: Nontender with normal range of motion in extremities. No lower extremity tenderness nor edema. Neurologic:  Normal speech and language. No gross focal neurologic deficits are appreciated.  Skin:  Skin is warm, dry and intact. No rash noted. Psychiatric: Mood and affect are normal. Speech and behavior are normal.   EKG: Reviewed by me, sinus rhythm the rate of 69 bpm, normal PR interval, normal QRS, normal QT ____________________________________________  ED COURSE:  As part of my medical decision making, I reviewed the following data within the electronic MEDICAL RECORD NUMBER History obtained from family if available, nursing notes, old chart and ekg, as well as notes from prior ED visits. Patient presented for abdominal pain, we will assess with labs and imaging as indicated at this time.   Procedures ____________________________________________   LABS (pertinent positives/negatives)  Labs Reviewed  COMPREHENSIVE METABOLIC PANEL - Abnormal; Notable for the following components:      Result Value   Glucose, Bld 121 (*)    All other components within normal limits  URINALYSIS, COMPLETE (UACMP) WITH MICROSCOPIC - Abnormal; Notable for the following components:   Color, Urine YELLOW (*)    APPearance CLEAR (*)    All other components within normal limits  LIPASE, BLOOD  CBC    RADIOLOGY Images were viewed by me  CT the abdomen pelvis with contrast IMPRESSION: 1. Acute uncomplicated sigmoid colon diverticulitis. 2.  Aortic Atherosclerosis (ICD10-I70.0).  ____________________________________________  DIFFERENTIAL DIAGNOSIS   Diverticulitis, appendicitis, gastroenteritis, dehydration  FINAL ASSESSMENT AND PLAN  Diverticulitis   Plan: The patient had presented for lower abdominal pain suggestive of diverticulitis. Patient's labs were reassuring. Patient's imaging did reveal uncomplicated sigmoid diverticulitis.  He will be discharged with Cipro and  Flagyl as well as pain medicine and antiemetics.  He is cleared for outpatient follow-up.   Ulice Dash, MD   Note: This note was generated in part or whole with voice recognition software. Voice recognition is usually quite accurate but there are transcription errors that can and very often do occur. I apologize for any typographical errors that were not detected and corrected.     Emily Filbert, MD 04/04/18 [phone]

## 2018-06-12 DIAGNOSIS — E119 Type 2 diabetes mellitus without complications: Secondary | ICD-10-CM | POA: Insufficient documentation

## 2018-07-17 ENCOUNTER — Other Ambulatory Visit: Payer: Self-pay

## 2018-07-17 ENCOUNTER — Emergency Department
Admission: EM | Admit: 2018-07-17 | Discharge: 2018-07-17 | Disposition: A | Discharge status: Home / Self Care | Payer: BLUE CROSS/BLUE SHIELD | Attending: Emergency Medicine | Admitting: Emergency Medicine

## 2018-07-17 DIAGNOSIS — I10 Essential (primary) hypertension: Secondary | ICD-10-CM | POA: Diagnosis not present

## 2018-07-17 DIAGNOSIS — L255 Unspecified contact dermatitis due to plants, except food: Secondary | ICD-10-CM | POA: Insufficient documentation

## 2018-07-17 DIAGNOSIS — Z79899 Other long term (current) drug therapy: Secondary | ICD-10-CM | POA: Insufficient documentation

## 2018-07-17 DIAGNOSIS — R21 Rash and other nonspecific skin eruption: Secondary | ICD-10-CM | POA: Diagnosis present

## 2018-07-17 MED ORDER — PREDNISONE 10 MG PO TABS: ORAL_TABLET | ORAL | 0 refills | Status: DC

## 2018-07-17 MED ORDER — DEXAMETHASONE SODIUM PHOSPHATE 10 MG/ML IJ SOLN
10.0000 mg | Freq: Once | INTRAMUSCULAR | Status: AC
  Administered 2018-07-17: 10 mg via INTRAMUSCULAR
  Filled 2018-07-17: qty 1

## 2018-07-17 NOTE — ED Triage Notes (Signed)
Pt denies any shortness of breath or trouble breathing.

## 2018-07-17 NOTE — ED Triage Notes (Signed)
Pt states he switched bp medications to lisinopril this week. Began having itchy rash Wednesday

## 2018-07-17 NOTE — ED Provider Notes (Signed)
[facility] Emergency Department Provider Note ____________________________________________  Time seen: 1800  I have reviewed the triage vital signs and the nursing notes.  HISTORY  Chief Complaint  Rash and Allergic Reaction   HPI Tyler Colon is a 47 y.o. male presents to the ER today with complaint of a rash on his bilateral arms.  He noticed this 5 days ago.  The rash itches and burns.  It has spread up his arms but has not spread anywhere else.  He denies any swelling of his throat, tongue or lips.  He reports he was started on lisinopril Tuesday and the rash started on Wednesday.  He called his PCP and notified them of the rash and they had him discontinue lisinopril and started him on losartan 25 mg daily.  He denies changes in soaps, lotions or detergents.  He has not come in contact with anything that he is allergic to.  He has not been out working in the yard.  He does not have pets that are outdoors and indoors.  He has tried Benadryl with minimal relief.  Past Medical History:  Diagnosis Date  . Chronic migraine   . Diverticulitis   . GERD (gastroesophageal reflux disease)   . Heart murmur   . Hypertension     Patient Active Problem List   Diagnosis Date Noted  . Cerumen impaction 11/18/2015  . Nasal sinus congestion 08/26/2015  . Sore throat 06/06/2015  . Anxiety 05/30/2015  . Chest pain 05/29/2015  . Diarrhea in adult patient 05/02/2015  . Elevated PSA 04/18/2015  . Elevated hemoglobin A1c measurement 04/18/2015  . Right ear impacted cerumen 04/18/2015  . Abdominal pain 04/16/2015  . Abnormal LFTs 04/16/2015  . Back ache 04/16/2015  . Encounter for general adult medical examination without abnormal findings 04/16/2015  . Dyslipidemia 04/16/2015  . Encounter for screening for malignant neoplasm of prostate 04/16/2015  . Migraine with aura and without status migrainosus, not intractable 04/16/2015  . Cannot sleep 04/16/2015  .  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 Blood glucose elevated 04/16/2015  . Undiagnosed cardiac murmurs 04/16/2015  . Fatigue 04/16/2015  . Family history of diabetes mellitus 04/16/2015  . Abnormal prostate specific antigen 04/16/2015  . Ear ache 04/16/2015    Past Surgical History:  Procedure Laterality Date  . APPENDECTOMY    . VASECTOMY  2002    Prior to Admission medications   Medication Sig Start Date End Date Taking? Authorizing Provider  azithromycin (ZITHROMAX) 250 MG tablet Take 1 tablet (250 mg total) by mouth daily. 08/11/17   Minna Antis, MD  baclofen (LIORESAL) 10 MG tablet Take 10 mg by mouth 2 (two) times daily as needed. 04/27/17   [provider]  ciprofloxacin (CIPRO) 500 MG tablet Take 1 tablet (500 mg total) by mouth 2 (two) times daily. 05/02/17   Governor Rooks, MD  HYDROcodone-acetaminophen (NORCO/VICODIN) 5-325 MG tablet Take 1 tablet by mouth every 6 (six) hours as needed for moderate pain. 05/02/17   Governor Rooks, MD  Magnesium Oxide 500 MG TABS Take 1 tablet by mouth daily. Take over the counter Magnesium Oxide 400-600 mg daily. If diarrhea is developed, try Magnesium Glycinate 400-600 mg daily. 04/27/17   [provider]  metoprolol succinate (TOPROL-XL) 25 MG 24 hr tablet Take 50 mg by mouth daily.  06/19/15 05/02/17  [provider]  metroNIDAZOLE (FLAGYL) 500 MG tablet Take 1 tablet (500 mg total) by mouth 2 (two) times daily. 05/02/17   Governor Rooks, MD  metroNIDAZOLE (  FLAGYL) 500 MG tablet Take 1 tablet (500 mg total) by mouth 3 (three) times daily. 04/04/18   Emily Filbert, MD  omeprazole (PRILOSEC) 10 MG capsule Take 10 mg by mouth daily as needed (for indigestion/heartburn).     [provider]  ondansetron (ZOFRAN ODT) 4 MG disintegrating tablet Take 1 tablet (4 mg total) by mouth every 8 (eight) hours as needed for nausea or vomiting. 04/04/18   Emily Filbert, MD  ondansetron (ZOFRAN) 4 MG tablet Take 1 tablet (4 mg  total) by mouth every 8 (eight) hours as needed for nausea or vomiting. 05/02/17   Governor Rooks, MD  oxyCODONE-acetaminophen (PERCOCET) 5-325 MG tablet Take 1-2 tablets by mouth every 8 (eight) hours as needed. 04/04/18   Emily Filbert, MD  predniSONE (DELTASONE) 10 MG tablet Take 3 tabs on days 1-3, take 2 tabs on days 4-6, take 1 tab on days 7-9 07/17/18   Lorre Munroe, NP  SUMAtriptan (IMITREX) 100 MG tablet Take 1 tablet (100 mg total) by mouth daily as needed for migraine. May repeat in 2 hours if headache persists or recurs. Patient not taking: Reported on 05/02/2017 11/18/15   Ellyn Hack, MD  Vitamin D, Ergocalciferol, (DRISDOL) 50000 units CAPS capsule Take 50,000 Units by mouth every 7 (seven) days. Take 50,000 Units by mouth once weekly for 16 weeks. 03/25/17   [provider]    Allergies Patient has no known allergies.  Family History  Problem Relation Age of Onset  . Hypertension Mother   . Diabetes Mother   . Cancer Father        Lung cancer  . Diabetes Father   . Heart failure Father   . Hypertension Maternal Aunt   . Cancer Maternal Aunt        Breast Cancer  . Hypertension Maternal Grandmother   . Diabetes Maternal Grandmother   . Cancer Maternal Grandfather        Lung Cancer    Social History Social History   Tobacco Use  . Smoking status: Never Smoker  . Smokeless tobacco: Never Used  Substance Use Topics  . Alcohol use: Yes    Alcohol/week: 0.0 standard drinks    Comment: Occasional alcohol use  . Drug use: No    Review of Systems  Constitutional: Negative for fever chills or body aches. Eyes: Negative for swelling around the eyes. ENT: Negative for lung of the throat, tongue or lips. Cardiovascular: Negative for chest pain. Respiratory: Negative for shortness of breath. Skin: As to for rash.  ____________________________________________  PHYSICAL EXAM:  VITAL SIGNS: ED Triage Vitals  Enc Vitals Group     BP 07/17/18  1716 (!) 150/90     Pulse Rate 07/17/18 1716 82     Resp 07/17/18 1716 18     Temp 07/17/18 1716 98.6 F (37 C)     Temp src --      SpO2 07/17/18 1716 100 %     Weight 07/17/18 1715 198 lb (89.8 kg)     Height 07/17/18 1715 5\' 5"  (1.651 m)     Head Circumference --      Peak Flow --      Pain Score 07/17/18 1715 0     Pain Loc --      Pain Edu? --      Excl. in GC? --     Constitutional: Alert and oriented. Well appearing and in no distress. Mouth/Throat: Mucous membranes are moist.  No erythema or edema noted. Hematological/Lymphatic/Immunological: No cervical lymphadenopathy. Cardiovascular: Normal rate, regular rhythm.  Respiratory: Normal respiratory effort. No wheezes/rales/rhonchi. Neurologic:  No gross focal neurologic deficits are appreciated. Skin: Scattered vesicular lesions on erythematous base in a linear pattern noted on bilateral upper extremities.   ____________________________________________  INITIAL IMPRESSION / ASSESSMENT AND PLAN / ED COURSE  Rash:  DDx include drug reaction, contact dermatitis Rash consistent with contact dermatitis due to plant Decadron 10 mg IM now RX for Pred Taper x 9 days given (recent dx of DM 2, A1C reviewed in care everywhere 6.7%). Advised him Prednisone will temporarily increase blood sugar. Continue Benadryl prn Cool compresses may be helpful ____________________________________________  FINAL CLINICAL IMPRESSION(S) / ED DIAGNOSES  Final diagnoses:  Rash  Contact dermatitis due to plant      Lorre Munroe, NP 07/17/18 1813    Arnaldo Natal, MD 07/18/18 [phone]

## 2018-07-17 NOTE — Discharge Instructions (Addendum)
You have been diagnosed with contact dermatitis.  We gave you a steroid injection in the ER.  I have given you a prescription for prednisone to take for 9 days.  Prednisone will increase your blood sugars but this will be temporary and should not affect youin the long-term.  You can take Benadryl 25 to 50 mg every 8 hours as needed for persistent itching.

## 2018-07-17 NOTE — ED Notes (Signed)
Rash  Itching  Both arms and stomach  X  4  Days  recent  Medication  Change   No angio edema   Speaking in complete sentances

## 2018-08-17 ENCOUNTER — Encounter: Payer: Self-pay | Admitting: Gastroenterology

## 2018-08-22 ENCOUNTER — Other Ambulatory Visit: Payer: Self-pay

## 2018-08-22 DIAGNOSIS — G4730 Sleep apnea, unspecified: Secondary | ICD-10-CM | POA: Insufficient documentation

## 2018-08-22 DIAGNOSIS — E669 Obesity, unspecified: Secondary | ICD-10-CM | POA: Insufficient documentation

## 2018-08-22 DIAGNOSIS — N529 Male erectile dysfunction, unspecified: Secondary | ICD-10-CM | POA: Insufficient documentation

## 2018-11-29 ENCOUNTER — Emergency Department
Admission: EM | Admit: 2018-11-29 | Discharge: 2018-11-29 | Disposition: A | Discharge status: Home / Self Care | Payer: BLUE CROSS/BLUE SHIELD | Attending: Emergency Medicine | Admitting: Emergency Medicine

## 2018-11-29 ENCOUNTER — Encounter: Payer: Self-pay | Admitting: Emergency Medicine

## 2018-11-29 ENCOUNTER — Other Ambulatory Visit: Payer: Self-pay

## 2018-11-29 ENCOUNTER — Emergency Department: Payer: BLUE CROSS/BLUE SHIELD

## 2018-11-29 DIAGNOSIS — E119 Type 2 diabetes mellitus without complications: Secondary | ICD-10-CM | POA: Insufficient documentation

## 2018-11-29 DIAGNOSIS — R1032 Left lower quadrant pain: Secondary | ICD-10-CM

## 2018-11-29 DIAGNOSIS — K5732 Diverticulitis of large intestine without perforation or abscess without bleeding: Secondary | ICD-10-CM | POA: Diagnosis not present

## 2018-11-29 DIAGNOSIS — I509 Heart failure, unspecified: Secondary | ICD-10-CM | POA: Insufficient documentation

## 2018-11-29 DIAGNOSIS — I110 Hypertensive heart disease with heart failure: Secondary | ICD-10-CM | POA: Insufficient documentation

## 2018-11-29 DIAGNOSIS — Z79899 Other long term (current) drug therapy: Secondary | ICD-10-CM | POA: Diagnosis not present

## 2018-11-29 DIAGNOSIS — Z7984 Long term (current) use of oral hypoglycemic drugs: Secondary | ICD-10-CM | POA: Diagnosis not present

## 2018-11-29 DIAGNOSIS — F419 Anxiety disorder, unspecified: Secondary | ICD-10-CM | POA: Diagnosis not present

## 2018-11-29 HISTORY — DX: Type 2 diabetes mellitus without complications: E11.9

## 2018-11-29 LAB — CBC
HCT: 44 % (ref 39.0–52.0)
Hemoglobin: 15 g/dL (ref 13.0–17.0)
MCH: 29.9 pg (ref 26.0–34.0)
MCHC: 34.1 g/dL (ref 30.0–36.0)
MCV: 87.8 fL (ref 80.0–100.0)
NRBC: 0 % (ref 0.0–0.2)
PLATELETS: 268 10*3/uL (ref 150–400)
RBC: 5.01 MIL/uL (ref 4.22–5.81)
RDW: 13.2 % (ref 11.5–15.5)
WBC: 6.8 10*3/uL (ref 4.0–10.5)

## 2018-11-29 LAB — COMPREHENSIVE METABOLIC PANEL
ALK PHOS: 53 U/L (ref 38–126)
ALT: 32 U/L (ref 0–44)
ANION GAP: 7 (ref 5–15)
AST: 25 U/L (ref 15–41)
Albumin: 4.5 g/dL (ref 3.5–5.0)
BILIRUBIN TOTAL: 1.2 mg/dL (ref 0.3–1.2)
BUN: 16 mg/dL (ref 6–20)
CALCIUM: 8.8 mg/dL — AB (ref 8.9–10.3)
CO2: 25 mmol/L (ref 22–32)
CREATININE: 1.02 mg/dL (ref 0.61–1.24)
Chloride: 107 mmol/L (ref 98–111)
Glucose, Bld: 130 mg/dL — ABNORMAL HIGH (ref 70–99)
Potassium: 3.3 mmol/L — ABNORMAL LOW (ref 3.5–5.1)
Sodium: 139 mmol/L (ref 135–145)
TOTAL PROTEIN: 7.8 g/dL (ref 6.5–8.1)

## 2018-11-29 LAB — LIPASE, BLOOD: Lipase: 38 U/L (ref 11–51)

## 2018-11-29 MED ORDER — ONDANSETRON HCL 4 MG/2ML IJ SOLN
4.0000 mg | Freq: Once | INTRAMUSCULAR | Status: AC
  Administered 2018-11-29: 4 mg via INTRAVENOUS
  Filled 2018-11-29: qty 2

## 2018-11-29 MED ORDER — MORPHINE SULFATE (PF) 4 MG/ML IV SOLN
4.0000 mg | Freq: Once | INTRAVENOUS | Status: AC
  Administered 2018-11-29: 4 mg via INTRAVENOUS
  Filled 2018-11-29: qty 1

## 2018-11-29 MED ORDER — IOHEXOL 300 MG/ML  SOLN
100.0000 mL | Freq: Once | INTRAMUSCULAR | Status: AC | PRN
  Administered 2018-11-29: 100 mL via INTRAVENOUS

## 2018-11-29 MED ORDER — METRONIDAZOLE 500 MG PO TABS: 500.0000 mg | ORAL_TABLET | Freq: Three times a day (TID) | ORAL | 0 refills | Status: DC

## 2018-11-29 MED ORDER — CIPROFLOXACIN HCL 500 MG PO TABS: 500.0000 mg | ORAL_TABLET | Freq: Two times a day (BID) | ORAL | 0 refills | Status: DC

## 2018-11-29 MED ORDER — ONDANSETRON 4 MG PO TBDP: 4.0000 mg | ORAL_TABLET | Freq: Three times a day (TID) | ORAL | 0 refills | Status: DC | PRN

## 2018-11-29 MED ORDER — NAPROXEN 500 MG PO TABS: 500.0000 mg | ORAL_TABLET | Freq: Two times a day (BID) | ORAL | 0 refills | Status: DC

## 2018-11-29 MED ORDER — SODIUM CHLORIDE 0.9 % IV BOLUS
1000.0000 mL | Freq: Once | INTRAVENOUS | Status: AC
  Administered 2018-11-29: 1000 mL via INTRAVENOUS

## 2018-11-29 MED ORDER — SODIUM CHLORIDE 0.9% FLUSH: 3.0000 mL | Freq: Once | INTRAVENOUS | Status: DC

## 2018-11-29 NOTE — Discharge Instructions (Signed)
Your CT scan shows mild diverticulitis.  Please take antibiotics to treat this and follow up with your doctor.

## 2018-11-29 NOTE — ED Triage Notes (Signed)
Pt arrived to the ED for complaints of abdominal pain. Pt reports that he has a history of diverticulitis and that this episode feels like it. Pt states that he has been experiencing this flare up  Since yesterday unrelieved by over the counter medication. Pt is AOx4 in moderate pain distress during triage.

## 2018-11-29 NOTE — ED Provider Notes (Signed)
[facility] Emergency Department Provider Note  ____________________________________________  Time seen: Approximately 11:42 AM  I have reviewed the triage vital signs and the nursing notes.   HISTORY  Chief Complaint Abdominal Pain    HPI Tyler Colon is a 48 y.o. male with a history of hypertension GERD and diabetes who complains of gradual onset of left lower quadrant abdominal pain for the past 2 days, worsening.  No aggravating or alleviating factors.  No vomiting diarrhea or constipation.  No fevers but he has chills and sweats last night.  Feels similar to prior diverticulitis episodes.  He denies any history of complicated diverticulitis such as perforation or abscess or any surgical intervention.   Denies black or bloody stool.    Past Medical History:  Diagnosis Date  . Chronic migraine   . Diabetes mellitus without complication (HCC)   . Diverticulitis   . GERD (gastroesophageal reflux disease)   . Heart murmur   . Hypertension      Patient Active Problem List   Diagnosis Date Noted  . Erectile dysfunction 08/22/2018  . Obesity (BMI 30-39.9) 08/22/2018  . Sleep apnea 08/22/2018  . IGT (impaired glucose tolerance) 03/12/2017  . Vitamin D deficiency 03/12/2017  . Essential hypertension 01/20/2017  . Cerumen impaction 11/18/2015  . Nasal sinus congestion 08/26/2015  . Sore throat 06/06/2015  . Anxiety 05/30/2015  . CHF (congestive heart failure) (HCC) 05/30/2015  . Chest pain 05/29/2015  . Diarrhea in adult patient 05/02/2015  . Elevated PSA 04/18/2015  . Elevated hemoglobin A1c measurement 04/18/2015  . Right ear impacted cerumen 04/18/2015  . Abdominal pain 04/16/2015  . Abnormal LFTs 04/16/2015  . Back ache 04/16/2015  . Encounter for general adult medical examination without abnormal findings 04/16/2015  . Dyslipidemia 04/16/2015  . Encounter for screening for malignant neoplasm of prostate 04/16/2015  . Migraine with aura  and without status migrainosus, not intractable 04/16/2015  . Cannot sleep 04/16/2015  . 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 Blood glucose elevated 04/16/2015  . Undiagnosed cardiac murmurs 04/16/2015  . Fatigue 04/16/2015  . Family history of diabetes mellitus 04/16/2015  . Abnormal prostate specific antigen 04/16/2015  . Ear ache 04/16/2015     Past Surgical History:  Procedure Laterality Date  . APPENDECTOMY    . COLONOSCOPY  12/13/2014   Repeat colonoscopy 12/2019 - Dr. Midge Minium, [facility]  . VASECTOMY  2002     Prior to Admission medications   Medication Sig Start Date End Date Taking? Authorizing Provider  losartan (COZAAR) 25 MG tablet  07/13/18  Yes [provider]  metFORMIN (GLUCOPHAGE-XR) 500 MG 24 hr tablet Take 500 mg by mouth daily.  07/13/18  Yes [provider]  metoprolol succinate (TOPROL-XL) 25 MG 24 hr tablet Take 50 mg by mouth daily.  06/19/15 11/29/18 Yes [provider]  SUMAtriptan (IMITREX) 100 MG tablet Take 1 tablet (100 mg total) by mouth daily as needed for migraine. May repeat in 2 hours if headache persists or recurs. 11/18/15  Yes Ellyn Hack, MD  Vitamin D, Ergocalciferol, (DRISDOL) 50000 units CAPS capsule Take 50,000 Units by mouth every 7 (seven) days. Take 50,000 Units by mouth once weekly for 16 weeks. 03/25/17  Yes [provider]  ciprofloxacin (CIPRO) 500 MG tablet Take 1 tablet (500 mg total) by mouth 2 (two) times daily. 11/29/18   Sharman Cheek, MD  metroNIDAZOLE (FLAGYL) 500 MG tablet Take 1 tablet (500 mg total) by mouth 3 (three) times daily. 11/29/18  Sharman CheekStafford, Travis Mastel, MD  naproxen (NAPROSYN) 500 MG tablet Take 1 tablet (500 mg total) by mouth 2 (two) times daily with a meal. 11/29/18   Sharman CheekStafford, Cheryllynn Sarff, MD  ondansetron (ZOFRAN ODT) 4 MG disintegrating tablet Take 1 tablet (4 mg total) by mouth every 8 (eight) hours as needed for nausea or vomiting. 11/29/18   Sharman CheekStafford, Proctor Carriker, MD      Allergies Lisinopril   Family History  Problem Relation Age of Onset  . Hypertension Mother   . Diabetes Mother   . Cancer Father        Lung cancer  . Diabetes Father   . Heart failure Father   . Hypertension Maternal Aunt   . Cancer Maternal Aunt        Breast Cancer  . Hypertension Maternal Grandmother   . Diabetes Maternal Grandmother   . Cancer Maternal Grandfather        Lung Cancer    Social History Social History   Tobacco Use  . Smoking status: Never Smoker  . Smokeless tobacco: Never Used  Substance Use Topics  . Alcohol use: Yes    Alcohol/week: 0.0 standard drinks    Comment: Occasional alcohol use  . Drug use: No    Review of Systems  Constitutional:   No fever positive chills.  ENT:   No sore throat. No rhinorrhea. Cardiovascular:   No chest pain or syncope. Respiratory:   No dyspnea or cough. Gastrointestinal:   Positive as above for abdominal pain without vomiting and diarrhea.  Musculoskeletal:   Negative for focal pain or swelling All other systems reviewed and are negative except as documented above in ROS and HPI.  ____________________________________________   PHYSICAL EXAM:  VITAL SIGNS: ED Triage Vitals  Enc Vitals Group     BP 11/29/18 0608 (!) 164/102     Pulse Rate 11/29/18 0608 (!) 105     Resp 11/29/18 0608 18     Temp 11/29/18 0608 99.1 F (37.3 C)     Temp Source 11/29/18 0608 Oral     SpO2 11/29/18 0608 97 %     Weight 11/29/18 0609 201 lb (91.2 kg)     Height 11/29/18 0609 5\' 4"  (1.626 m)     Head Circumference --      Peak Flow --      Pain Score 11/29/18 0609 8     Pain Loc --      Pain Edu? --      Excl. in GC? --     Vital signs reviewed, nursing assessments reviewed.   Constitutional:   Alert and oriented. Non-toxic appearance. Eyes:   Conjunctivae are normal. EOMI. PERRL. ENT      Head:   Normocephalic and atraumatic.      Nose:   No congestion/rhinnorhea.       Mouth/Throat:   MMM, no  pharyngeal erythema. No peritonsillar mass.       Neck:   No meningismus. Full ROM. Hematological/Lymphatic/Immunilogical:   No cervical lymphadenopathy. Cardiovascular:   RRR. Symmetric bilateral radial and DP pulses.  No murmurs. Cap refill less than 2 seconds. Respiratory:   Normal respiratory effort without tachypnea/retractions. Breath sounds are clear and equal bilaterally. No wheezes/rales/rhonchi. Gastrointestinal:   Soft with diffuse left-sided abdominal tenderness. Non distended. There is no CVA tenderness.  No rebound, rigidity, or guarding. Musculoskeletal:   Normal range of motion in all extremities. No joint effusions.  No lower extremity tenderness.  No edema. Neurologic:   Normal speech and  language.  Motor grossly intact. No acute focal neurologic deficits are appreciated.  Skin:    Skin is warm, dry and intact. No rash noted.  No petechiae, purpura, or bullae.  ____________________________________________    LABS (pertinent positives/negatives) (all labs ordered are listed, but only abnormal results are displayed) Labs Reviewed  COMPREHENSIVE METABOLIC PANEL - Abnormal; Notable for the following components:      Result Value   Potassium 3.3 (*)    Glucose, Bld 130 (*)    Calcium 8.8 (*)    All other components within normal limits  LIPASE, BLOOD  CBC  URINALYSIS, COMPLETE (UACMP) WITH MICROSCOPIC   ____________________________________________   EKG    ____________________________________________    RADIOLOGY  Ct Abdomen Pelvis W Contrast  Result Date: 11/29/2018 CLINICAL DATA:  Abdominal pain EXAM: CT ABDOMEN AND PELVIS WITH CONTRAST TECHNIQUE: Multidetector CT imaging of the abdomen and pelvis was performed using the standard protocol following bolus administration of intravenous contrast. CONTRAST:  100mL OMNIPAQUE IOHEXOL 300 MG/ML  SOLN COMPARISON:  April 04, 2018 FINDINGS: Lower chest: Lung bases are clear. Hepatobiliary: No focal liver lesions are  appreciable. Gallbladder wall is not appreciably thickened. There is no biliary duct dilatation. Pancreas: There is no pancreatic mass or inflammatory focus. Spleen: No splenic lesions are evident. Adrenals/Urinary Tract: Adrenals appear stable and unremarkable. There is mild thickening along the medial limb of the left adrenal, a stable finding. There is a 6 mm probable cyst in the mid left kidney laterally. There is no evident hydronephrosis on either side. There is no appreciable renal or ureteral calculus on either side. The urinary bladder wall thickness is felt to be within normal limits for degree of distention. Stomach/Bowel: There are multiple sigmoid diverticula. Mild wall thickening in portions of the sigmoid colon likely represents a degree of muscular hypertrophy from chronic diverticulosis. There is no mesenteric stranding or fluid suggesting frank diverticulitis. It should be noted that earliest changes of diverticulitis may present with wall thickening apart from surrounding mesenteric thickening. No evident abscess or perforation in the sigmoid colon region. Elsewhere, there is no appreciable bowel wall thickening. No evident bowel obstruction. No free air or portal venous air. Vascular/Lymphatic: There is no abdominal aortic aneurysm. Slight vascular calcification is noted in the aorta. There is also mild calcification in each common iliac artery. There is no appreciable adenopathy in the abdomen or pelvis. Reproductive: There are several prostatic calculi. Prostate is mildly enlarged for age. The seminal vesicles appear normal. No pelvic masses evident. Other: By report, the patient has had an appendectomy. There is a small appendiceal remnant which does not appear inflamed. A small appendicolith is present in this remnant proximally. No ascites or abscess is evident in the abdomen or pelvis. Musculoskeletal: There is no blastic or lytic bone lesion. No intramuscular or abdominal wall lesion.  IMPRESSION: 1. Extensive sigmoid diverticulosis. Moderate wall thickening is noted in the sigmoid colon which may be due to muscular hypertrophy from chronic diverticulosis. Earliest changes of diverticulitis are difficult to exclude in this circumstance, although there is no overt diverticulitis evident. 2. There is an apparent appendiceal remnant without inflammation. Small appendicolith is present in the proximal appendiceal remnant. 3.  No bowel obstruction.  No abscess in the abdomen pelvis. 4. Prostate is rather prominent for age. There are several prostatic calculi. This finding warrants clinical assessment as well as PSA evaluation. 5.  No evident renal or ureteral calculus.  No hydronephrosis. Electronically Signed   By: Bretta BangWilliam  Woodruff  III M.D.   On: 11/29/2018 09:06    ____________________________________________   PROCEDURES Procedures  ____________________________________________  DIFFERENTIAL DIAGNOSIS   Diverticulitis, pyelonephritis, ureteral obstruction/hydronephrosis, bowel obstruction  CLINICAL IMPRESSION / ASSESSMENT AND PLAN / ED COURSE  Pertinent labs & imaging results that were available during my care of the patient were reviewed by me and considered in my medical decision making (see chart for details).    Patient nontoxic, presents with slightly elevated temperature, mild tachycardia of 105.  Other vital signs unremarkable.  With his comorbidities including obesity and history of diverticulitis, CT scan is needed to evaluate for possible complications.  No peritoneal signs on exam but he does have significant tenderness.  ----------------------------------------- 11:50 AM on 11/29/2018 -----------------------------------------  CT shows signs of mild diverticulitis without complication, patient feeling better.  Tachycardia resolved after IV fluids, patient stable for discharge home on oral antibiotic therapy and follow-up with primary care.       ____________________________________________   FINAL CLINICAL IMPRESSION(S) / ED DIAGNOSES    Final diagnoses:  LLQ pain  Diverticulitis of large intestine without perforation or abscess without bleeding     ED Discharge Orders         Ordered    ciprofloxacin (CIPRO) 500 MG tablet  2 times daily     11/29/18 1141    metroNIDAZOLE (FLAGYL) 500 MG tablet  3 times daily     11/29/18 1141    ondansetron (ZOFRAN ODT) 4 MG disintegrating tablet  Every 8 hours PRN     11/29/18 1141    naproxen (NAPROSYN) 500 MG tablet  2 times daily with meals     11/29/18 1141          Portions of this note were generated with dragon dictation software. Dictation errors may occur despite best attempts at proofreading.   Sharman Cheek, MD 11/29/18 1151

## 2018-12-22 ENCOUNTER — Other Ambulatory Visit: Payer: Self-pay

## 2018-12-22 ENCOUNTER — Emergency Department: Payer: BLUE CROSS/BLUE SHIELD

## 2018-12-22 ENCOUNTER — Emergency Department
Admission: EM | Admit: 2018-12-22 | Discharge: 2018-12-23 | Disposition: A | Discharge status: Home / Self Care | Payer: BLUE CROSS/BLUE SHIELD | Attending: Emergency Medicine | Admitting: Emergency Medicine

## 2018-12-22 DIAGNOSIS — I110 Hypertensive heart disease with heart failure: Secondary | ICD-10-CM | POA: Insufficient documentation

## 2018-12-22 DIAGNOSIS — R202 Paresthesia of skin: Secondary | ICD-10-CM | POA: Insufficient documentation

## 2018-12-22 DIAGNOSIS — R0789 Other chest pain: Secondary | ICD-10-CM

## 2018-12-22 DIAGNOSIS — I509 Heart failure, unspecified: Secondary | ICD-10-CM | POA: Insufficient documentation

## 2018-12-22 DIAGNOSIS — R079 Chest pain, unspecified: Secondary | ICD-10-CM | POA: Diagnosis present

## 2018-12-22 DIAGNOSIS — Z79899 Other long term (current) drug therapy: Secondary | ICD-10-CM | POA: Diagnosis not present

## 2018-12-22 DIAGNOSIS — E119 Type 2 diabetes mellitus without complications: Secondary | ICD-10-CM | POA: Diagnosis not present

## 2018-12-22 DIAGNOSIS — Z7984 Long term (current) use of oral hypoglycemic drugs: Secondary | ICD-10-CM | POA: Insufficient documentation

## 2018-12-22 LAB — CBC
HCT: 42.6 % (ref 39.0–52.0)
Hemoglobin: 14.4 g/dL (ref 13.0–17.0)
MCH: 29.9 pg (ref 26.0–34.0)
MCHC: 33.8 g/dL (ref 30.0–36.0)
MCV: 88.6 fL (ref 80.0–100.0)
Platelets: 261 K/uL (ref 150–400)
RBC: 4.81 MIL/uL (ref 4.22–5.81)
RDW: 13.4 % (ref 11.5–15.5)
WBC: 7.1 K/uL (ref 4.0–10.5)
nRBC: 0 % (ref 0.0–0.2)

## 2018-12-22 LAB — BASIC METABOLIC PANEL WITH GFR
Anion gap: 8 (ref 5–15)
BUN: 18 mg/dL (ref 6–20)
CO2: 26 mmol/L (ref 22–32)
Calcium: 8.9 mg/dL (ref 8.9–10.3)
Chloride: 105 mmol/L (ref 98–111)
Creatinine, Ser: 0.94 mg/dL (ref 0.61–1.24)
GFR calc Af Amer: >60 mL/min
GFR calc non Af Amer: >60 mL/min
Glucose, Bld: 103 mg/dL — ABNORMAL HIGH (ref 70–99)
Potassium: 3.4 mmol/L — ABNORMAL LOW (ref 3.5–5.1)
Sodium: 139 mmol/L (ref 135–145)

## 2018-12-22 LAB — TROPONIN I: Troponin I: <0.03 ng/mL (ref <0.03)

## 2018-12-22 MED ORDER — SODIUM CHLORIDE 0.9% FLUSH: 3.0000 mL | Freq: Once | INTRAVENOUS | Status: DC

## 2018-12-22 NOTE — ED Notes (Signed)
Patient up to stat desk asking about wait times and reporting desire to leave. Patient cautioned against the dangers of leaving without further assessment. Patient agreed to stay.

## 2018-12-22 NOTE — ED Notes (Signed)
Pt up to desk, inquiring about results; pt states, "I just want to make sure I wasn't having mini strokes."  Pt reports two previous episodes of left side numbness from face down to leg, states his cardiologist advised he be seen to rule out mini stroke.  EDP, Siadecki notified.  See new orders.

## 2018-12-22 NOTE — ED Notes (Signed)
Patient transported to CT 

## 2018-12-22 NOTE — ED Triage Notes (Signed)
Patient c/o medial chest tightness and left-sided facial and arm numbness yesterday and again today.

## 2018-12-23 ENCOUNTER — Emergency Department: Payer: BLUE CROSS/BLUE SHIELD

## 2018-12-23 LAB — TROPONIN I: Troponin I: <0.03 ng/mL (ref <0.03)

## 2018-12-23 MED ORDER — GADOBUTROL 1 MMOL/ML IV SOLN
8.0000 mL | Freq: Once | INTRAVENOUS | Status: AC | PRN
  Administered 2018-12-23: 8 mL via INTRAVENOUS

## 2018-12-23 NOTE — ED Provider Notes (Signed)
[facility] Emergency Department Provider Note ________________________   First MD Initiated Contact with Patient 12/22/18 2350     (approximate)  I have reviewed the triage vital signs and the nursing notes.   HISTORY  Chief Complaint Chest Pain   HPI Kadeen Tudisco is a 48 y.o. male with below list of chronic medical conditions presents to the emergency department with acute onset of left side face chest arm leg numbness and weakness intermittently x2 episodes which patient states began yesterday while at work.  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  Patient states that the episodes brief however reoccurred.  Patient denies any complaints at present.        Past Medical History:  Diagnosis Date   Chronic migraine    Diabetes mellitus without complication (HCC)    Diverticulitis    GERD (gastroesophageal reflux disease)    Heart murmur    Hypertension     Patient Active Problem List   Diagnosis Date Noted   Erectile dysfunction 08/22/2018   Obesity (BMI 30-39.9) 08/22/2018   Sleep apnea 08/22/2018   IGT (impaired glucose tolerance) 03/12/2017   Vitamin D deficiency 03/12/2017   Essential hypertension 01/20/2017   Cerumen impaction 11/18/2015   Nasal sinus congestion 08/26/2015   Sore throat 06/06/2015   Anxiety 05/30/2015   CHF (congestive heart failure) (HCC) 05/30/2015   Chest pain 05/29/2015   Diarrhea in adult patient 05/02/2015   Elevated PSA 04/18/2015   Elevated hemoglobin A1c measurement 04/18/2015   Right ear impacted cerumen 04/18/2015   Abdominal pain 04/16/2015   Abnormal LFTs 04/16/2015   Back ache 04/16/2015   Encounter for general adult medical examination without abnormal findings 04/16/2015   Dyslipidemia 04/16/2015   Encounter for screening for malignant neoplasm of prostate 04/16/2015   Migraine with aura and without status migrainosus, not  intractable 04/16/2015   Cannot sleep 04/16/2015   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 Midge Minium, [facility]   VASECTOMY  2002    Prior to Admission medications   Medication Sig Start Date End Date Taking? Authorizing Provider  ciprofloxacin (CIPRO) 500 MG tablet Take 1 tablet (500 mg total) by mouth 2 (two) times daily. 11/29/18   Sharman Cheek, MD  losartan (COZAAR) 25 MG tablet  07/13/18   [provider]  metFORMIN (GLUCOPHAGE-XR) 500 MG 24 hr tablet Take 500 mg by mouth daily.  07/13/18   [provider]  metoprolol succinate (TOPROL-XL) 25 MG 24 hr tablet Take 50 mg by mouth daily.  06/19/15 11/29/18  [provider]  metroNIDAZOLE (FLAGYL) 500 MG tablet Take 1 tablet (500 mg total) by mouth 3 (three) times daily. 11/29/18   Sharman Cheek, MD  naproxen (NAPROSYN) 500 MG tablet Take 1 tablet (500 mg total) by mouth 2 (two) times daily with a meal. 11/29/18   Sharman Cheek, MD  ondansetron (ZOFRAN ODT) 4 MG disintegrating tablet Take 1 tablet (4 mg total) by mouth every 8 (eight) hours as needed for nausea or vomiting. 11/29/18   Sharman Cheek, MD  SUMAtriptan (IMITREX) 100 MG tablet  Take 1 tablet (100 mg total) by mouth daily as needed for migraine. May repeat in 2 hours if headache persists or recurs. 11/18/15   Ellyn Hack, MD  Vitamin D, Ergocalciferol, (DRISDOL) 50000 units CAPS capsule Take 50,000 Units by mouth every 7 (seven) days. Take 50,000 Units by mouth once weekly for 16 weeks. 03/25/17   [provider]    Allergies Lisinopril  Family History  Problem Relation Age of Onset     Hypertension Mother    Diabetes Mother    Cancer Father        Lung cancer   Diabetes Father    Heart failure Father    Hypertension Maternal Aunt    Cancer Maternal Aunt        Breast Cancer   Hypertension Maternal Grandmother    Diabetes Maternal Grandmother    Cancer Maternal Grandfather        Lung Cancer    Social History Social History   Tobacco Use   Smoking status: Never Smoker   Smokeless tobacco: Never Used  Substance Use Topics   Alcohol use: Yes    Alcohol/week: 0.0 standard drinks    Comment: Occasional alcohol use   Drug use: No    Review of Systems Constitutional: No fever/chills Eyes: No visual changes. ENT: No sore throat. Cardiovascular: Denies chest pain. Respiratory: Denies shortness of breath. Gastrointestinal: No abdominal pain.  No nausea, no vomiting.  No diarrhea.  No constipation. Genitourinary: Negative for dysuria. Musculoskeletal: Negative for neck pain.  Negative for back pain. Integumentary: Negative for rash. Neurological: Negative for headaches, focal weakness or numbness.   ____________________________________________   PHYSICAL EXAM:  VITAL SIGNS: ED Triage Vitals  Enc Vitals Group     BP 12/22/18 1902 (!) 156/103     Pulse Rate 12/22/18 1902 78     Resp 12/22/18 1902 20     Temp 12/22/18 1902 98.3 F (36.8 C)     Temp Source 12/22/18 1902 Oral     SpO2 12/22/18 1902 97 %     Weight 12/22/18 1904 91 kg (200 lb 9.9 oz)     Height 12/22/18 1904 1.626 m ( )     Head Circumference --      Peak Flow --      Pain Score 12/22/18 1904 5     Pain Loc --      Pain Edu? --      Excl. in GC? --     Constitutional: Alert and oriented. Well appearing and in no acute distress. Eyes: Conjunctivae are normal. PERRL. EOMI. Mouth/Throat: Mucous membranes are moist. Oropharynx non-erythematous. Neck: No stridor.  Cardiovascular: Normal rate, regular rhythm. Good peripheral circulation. Grossly normal heart  sounds. Respiratory: Normal respiratory effort.  No retractions. Lungs CTAB. Gastrointestinal: Soft and nontender. No distention.  Musculoskeletal: No lower extremity tenderness nor edema. No gross deformities of extremities. Neurologic:  Normal speech and language. No gross focal neurologic deficits are appreciated.  Skin:  Skin is warm, dry and intact. No rash noted. Psychiatric: Mood and affect are normal. Speech and behavior are normal.  ____________________________________________   LABS (all labs ordered are listed, but only abnormal results are displayed)  Labs Reviewed  BASIC METABOLIC PANEL - Abnormal; Notable for the following components:      Result Value   Potassium 3.4 (*)    Glucose, Bld 103 (*)    All other components within normal limits  CBC  TROPONIN I  TROPONIN I  ____________________________________________  EKG  ED ECG REPORT I, [place] N Miklos Bidinger, the attending physician, personally viewed and interpreted this ECG.   Date: 12/22/2018  EKG Time: 7:05 PM   Rate: 73  Rhythm: Normal sinus rhythm  Axis: Normal  Intervals: Normal  ST&T Change: None   ED ECG REPORT I, [place] N Deseri Loss, the attending physician, personally viewed and interpreted this ECG.   Date: 12/22/2018  EKG Time: 11:45 PM  Rate: 61  Rhythm: Normal sinus rhythm  Axis: Normal  Intervals: Normal  ST&T Change: None   ____________________________________________  RADIOLOGY I, [place] N Ether Wolters, personally viewed and evaluated these images (plain radiographs) as part of my medical decision making, as well as reviewing the written report by the radiologist.  ED MD interpretation: No active cardiopulmonary disease noted on chest x-ray  CT head without contrast revealed no acute findings  MRI with and without contrast of the brain revealed normal MRI for the patient's age.  MRA of the neck revealed left dominant vertebral artery short segment of severe stenosis involving the  proximal right vertebral artery  Official radiology report(s): Dg Chest 2 View  Result Date: 12/22/2018 CLINICAL DATA:  Chest pain EXAM: CHEST - 2 VIEW COMPARISON:  08/11/2017 FINDINGS: The heart size and mediastinal contours are within normal limits. Both lungs are clear. The visualized skeletal structures are unremarkable. IMPRESSION: No active cardiopulmonary disease. Electronically Signed   By: Deatra Robinson M.D.   On: 12/22/2018 19:38   Ct Head Wo Contrast  Result Date: 12/22/2018 CLINICAL DATA:  Medial chest tightness. Left-sided facial and arm numbness yesterday and again today. EXAM: CT HEAD WITHOUT CONTRAST TECHNIQUE: Contiguous axial images were obtained from the base of the skull through the vertex without intravenous contrast. COMPARISON:  None. FINDINGS: Brain: No evidence of acute infarction, hemorrhage, hydrocephalus, extra-axial collection or mass lesion/mass effect. Vascular: No hyperdense vessel or unexpected calcification. Skull: Calvarium appears intact. Sinuses/Orbits: Paranasal sinuses and mastoid air cells are clear. Other: None. IMPRESSION: No acute intracranial abnormalities. Electronically Signed   By: Burman Nieves M.D.   On: 12/22/2018 21:09   Mr Angiogram Neck W Or Wo Contrast  Result Date: 12/23/2018 CLINICAL DATA:  Initial evaluation for dizziness with episodes of left-sided numbness. EXAM: MRI HEAD WITHOUT CONTRAST MRA NECK WITHOUT AND WITH CONTRAST TECHNIQUE: Multiplanar, multiecho pulse sequences of the brain and surrounding structures were obtained without intravenous contrast. Angiographic images of the neck were obtained using MRA technique without and with intravenous contrast. Carotid stenosis measurements (when applicable) are obtained utilizing NASCET criteria, using the distal internal carotid diameter as the denominator. CONTRAST:  80 cc of Gadavist. COMPARISON:  Prior CT from 12/22/2018. FINDINGS: MRI HEAD FINDINGS Brain: Cerebral volume within normal  limits for patient age. Minimal hazy T2/FLAIR hyperintensity within the periventricular white matter, nonspecific, but felt to be within normal limits for age. No abnormal foci of restricted diffusion to suggest acute or subacute ischemia. Gray-white matter differentiation well maintained. No encephalomalacia to suggest chronic infarction. No foci of susceptibility artifact to suggest acute or chronic intracranial hemorrhage. No mass lesion, midline shift or mass effect. No hydrocephalus. No extra-axial fluid collection. Major dural sinuses are grossly patent. Pituitary gland and suprasellar region are normal. Midline structures intact and normal. No abnormal enhancement. Vascular: Major intracranial vascular flow voids well maintained and normal in appearance. Skull and upper cervical spine: Craniocervical junction normal. Visualized upper cervical spine within normal limits. Bone marrow signal intensity normal. No scalp soft tissue abnormality. Sinuses/Orbits: Globes and orbital  soft tissues within normal limits. Paranasal sinuses are clear. No mastoid effusion. Inner ear structures normal. Other: None. MRA NECK FINDINGS Source images reviewed. Visualized aortic arch of normal caliber with normal branch pattern. No flow-limiting stenosis about the origin of the great vessels. Visualized subclavian arteries widely patent. Right common carotid artery widely patent from its origin to the bifurcation. No significant atheromatous narrowing about the right bifurcation. Right ICA widely patent distally to the circle-of-Willis without stenosis or occlusion. Left common carotid artery patent from its origin to the bifurcation without stenosis. No significant atheromatous narrowing about the left bifurcation. Left ICA widely patent from the bifurcation to the circle-of-Willis without stenosis or occlusion. Both of the vertebral arteries arise from the subclavian arteries. Dominant left vertebral artery widely patent within  the neck. Right vertebral artery diffusely hypoplastic. Single short-segment severe stenosis at the proximal right V1 segment (series 1052, image 11). Right vertebral artery otherwise widely patent within the neck. IMPRESSION: MRI HEAD IMPRESSION: Normal brain MRI for age. No acute intracranial infarct or other abnormality identified. MRA NECK IMPRESSION: 1. Widely patent common and internal carotid arteries bilaterally without hemodynamically significant stenosis or other vascular abnormality. 2. Short-segment severe stenosis involving the proximal right vertebral artery. Vertebral arteries otherwise widely patent within the neck. Left vertebral artery dominant. Electronically Signed   By: Rise Mu M.D.   On: 12/23/2018 02:59   Mr Laqueta Jean And Wo Contrast  Result Date: 12/23/2018 CLINICAL DATA:  Initial evaluation for dizziness with episodes of left-sided numbness. EXAM: MRI HEAD WITHOUT CONTRAST MRA NECK WITHOUT AND WITH CONTRAST TECHNIQUE: Multiplanar, multiecho pulse sequences of the brain and surrounding structures were obtained without intravenous contrast. Angiographic images of the neck were obtained using MRA technique without and with intravenous contrast. Carotid stenosis measurements (when applicable) are obtained utilizing NASCET criteria, using the distal internal carotid diameter as the denominator. CONTRAST:  80 cc of Gadavist. COMPARISON:  Prior CT from 12/22/2018. FINDINGS: MRI HEAD FINDINGS Brain: Cerebral volume within normal limits for patient age. Minimal hazy T2/FLAIR hyperintensity within the periventricular white matter, nonspecific, but felt to be within normal limits for age. No abnormal foci of restricted diffusion to suggest acute or subacute ischemia. Gray-white matter differentiation well maintained. No encephalomalacia to suggest chronic infarction. No foci of susceptibility artifact to suggest acute or chronic intracranial hemorrhage. No mass lesion, midline shift or  mass effect. No hydrocephalus. No extra-axial fluid collection. Major dural sinuses are grossly patent. Pituitary gland and suprasellar region are normal. Midline structures intact and normal. No abnormal enhancement. Vascular: Major intracranial vascular flow voids well maintained and normal in appearance. Skull and upper cervical spine: Craniocervical junction normal. Visualized upper cervical spine within normal limits. Bone marrow signal intensity normal. No scalp soft tissue abnormality. Sinuses/Orbits: Globes and orbital soft tissues within normal limits. Paranasal sinuses are clear. No mastoid effusion. Inner ear structures normal. Other: None. MRA NECK FINDINGS Source images reviewed. Visualized aortic arch of normal caliber with normal branch pattern. No flow-limiting stenosis about the origin of the great vessels. Visualized subclavian arteries widely patent. Right common carotid artery widely patent from its origin to the bifurcation. No significant atheromatous narrowing about the right bifurcation. Right ICA widely patent distally to the circle-of-Willis without stenosis or occlusion. Left common carotid artery patent from its origin to the bifurcation without stenosis. No significant atheromatous narrowing about the left bifurcation. Left ICA widely patent from the bifurcation to the circle-of-Willis without stenosis or occlusion. Both of the vertebral arteries arise  from the subclavian arteries. Dominant left vertebral artery widely patent within the neck. Right vertebral artery diffusely hypoplastic. Single short-segment severe stenosis at the proximal right V1 segment (series 1052, image 11). Right vertebral artery otherwise widely patent within the neck. IMPRESSION: MRI HEAD IMPRESSION: Normal brain MRI for age. No acute intracranial infarct or other abnormality identified. MRA NECK IMPRESSION: 1. Widely patent common and internal carotid arteries bilaterally without hemodynamically significant  stenosis or other vascular abnormality. 2. Short-segment severe stenosis involving the proximal right vertebral artery. Vertebral arteries otherwise widely patent within the neck. Left vertebral artery dominant. Electronically Signed   By: Rise Mu M.D.   On: 12/23/2018 02:59    ____________________________________________     Procedures   ____________________________________________   INITIAL IMPRESSION / MDM / ASSESSMENT AND PLAN / ED COURSE  As part of my medical decision making, I reviewed the following data within the electronic MEDICAL RECORD NUMBER   48 year old male presenting with above-stated history and physical exam secondary to intermittent left-sided numbness and weakness of the left upper extremity which began yesterday.  Laboratory data unremarkable patient CT scan and MRI/MRA of the head and neck revealed no clear etiology for the patient's symptoms.  Patient referred to his primary care provider.  Patient advised to take a full dose aspirin. ____________________________________________  FINAL CLINICAL IMPRESSION(S) / ED DIAGNOSES  Final diagnoses:  Other chest pain  Paresthesia     MEDICATIONS GIVEN DURING THIS VISIT:  Medications  sodium chloride flush (NS) 0.9 % injection 3 mL (has no administration in time range)  gadobutrol (GADAVIST) 1 MMOL/ML injection 8 mL (8 mLs Intravenous Contrast Given 12/23/18 0201)     ED Discharge Orders    None       Note:  This document was prepared using Dragon voice recognition software and may include unintentional dictation errors.   Darci Current, MD 12/23/18 [phone]

## 2018-12-23 NOTE — ED Notes (Signed)
This RN tried for IV x2. Will have 2nd RN try.

## 2019-04-20 ENCOUNTER — Other Ambulatory Visit: Payer: Self-pay | Admitting: Family Medicine

## 2019-04-20 DIAGNOSIS — Z20822 Contact with and (suspected) exposure to covid-19: Secondary | ICD-10-CM

## 2019-04-25 LAB — NOVEL CORONAVIRUS, NAA: SARS-CoV-2, NAA: NOT DETECTED

## 2019-12-07 ENCOUNTER — Encounter: Payer: Self-pay | Admitting: *Deleted

## 2020-01-08 ENCOUNTER — Ambulatory Visit: Payer: Self-pay | Attending: Internal Medicine

## 2020-01-08 DIAGNOSIS — Z23 Encounter for immunization: Secondary | ICD-10-CM

## 2020-01-08 NOTE — Progress Notes (Signed)
   Covid-19 Vaccination Clinic  Name:  Tyler Colon    MRN: [id number] DOB: Oct 23, 1970  01/08/2020  [name] was observed post Covid-19 immunization for 15 minutes without incident. He was provided with Vaccine Information Sheet and instruction to access the V-Safe system.   [name] was instructed to call 911 with any severe reactions post vaccine: Marland Kitchen Difficulty breathing  . Swelling of face and throat  . A fast heartbeat  . A bad rash all over body  . Dizziness and weakness   Immunizations Administered    Name Date Dose VIS Date Route   Pfizer COVID-19 Vaccine 01/08/2020  4:03 PM 0.3 mL 09/22/2019 Intramuscular   Manufacturer: [employer], [employer]   Lot: [id number]   NDC: [address]-1

## 2020-01-10 ENCOUNTER — Ambulatory Visit (INDEPENDENT_AMBULATORY_CARE_PROVIDER_SITE_OTHER): Payer: Self-pay | Admitting: Gastroenterology

## 2020-01-10 DIAGNOSIS — K635 Polyp of colon: Secondary | ICD-10-CM

## 2020-01-10 MED ORDER — NA SULFATE-K SULFATE-MG SULF 17.5-3.13-1.6 GM/177ML PO SOLN: 1.0000 | Freq: Once | ORAL | 0 refills | Status: AC

## 2020-01-10 NOTE — Progress Notes (Signed)
Gastroenterology Pre-Procedure Review  Request Date: Tuesday 01/30/20 Requesting Physician: Dr. Allen Norris  PATIENT REVIEW QUESTIONS: The patient responded to the following health history questions as indicated:    1. Are you having any GI issues? no 2. Do you have a personal history of Polyps? yes (unsure of the year) 3. Do you have a family history of Colon Cancer or Polyps? no 4. Diabetes Mellitus? yes (type 2) 5. Joint replacements in the past 12 months?no 6. Major health problems in the past 3 months?no 7. Any artificial heart valves, MVP, or defibrillator?no    MEDICATIONS & ALLERGIES:    Patient reports the following regarding taking any anticoagulation/antiplatelet therapy:   Plavix, Coumadin, Eliquis, Xarelto, Lovenox, Pradaxa, Brilinta, or Effient? no Aspirin? no  Patient confirms/reports the following medications:  Current Outpatient Medications  Medication Sig Dispense Refill  . atorvastatin (LIPITOR) 20 MG tablet Take 20 mg by mouth daily.    Marland Kitchen glucose blood (PRECISION QID TEST) test strip Use 1 each (1 strip total) once daily for 90 days    . Lancets Misc. (UNISTIK 2 NORMAL) MISC Use 1 each once daily for 90 days    . losartan (COZAAR) 50 MG tablet Take 50 mg by mouth daily.    . metFORMIN (GLUCOPHAGE-XR) 500 MG 24 hr tablet Take 500 mg by mouth daily.     . metoprolol succinate (TOPROL-XL) 50 MG 24 hr tablet Take 50 mg by mouth daily.    . sildenafil (REVATIO) 20 MG tablet TAKE 1 2 TABLETS (20 40 MG TOTAL) BY MOUTH ONCE DAILY AS NEEDED (PRIOR TO INTERCOURSE)    . Na Sulfate-K Sulfate-Mg Sulf 17.5-3.13-1.6 GM/177ML SOLN Take 1 kit by mouth once for 1 dose. 354 mL 0  . SUMAtriptan (IMITREX) 100 MG tablet Take 1 tablet (100 mg total) by mouth daily as needed for migraine. May repeat in 2 hours if headache persists or recurs. (Patient not taking: Reported on 01/10/2020) 30 tablet 2   No current facility-administered medications for this visit.    Patient confirms/reports the  following allergies:  Allergies  Allergen Reactions  . Lisinopril Rash    No orders of the defined types were placed in this encounter.   AUTHORIZATION INFORMATION Primary Insurance: 1D#: Group #:  Secondary Insurance: 1D#: Group #:  SCHEDULE INFORMATION: Date: Tuesday 01/30/20 Time: Location:[facility]

## 2020-01-26 ENCOUNTER — Other Ambulatory Visit: Admission: RE | Admit: 2020-01-26 | Payer: BC Managed Care – PPO | Source: Ambulatory Visit

## 2020-01-29 ENCOUNTER — Other Ambulatory Visit
Admission: RE | Admit: 2020-01-29 | Discharge: 2020-01-29 | Disposition: A | Discharge status: Home / Self Care | Payer: BC Managed Care – PPO | Source: Ambulatory Visit | Attending: Gastroenterology | Admitting: Gastroenterology

## 2020-01-29 ENCOUNTER — Other Ambulatory Visit: Payer: Self-pay

## 2020-01-29 DIAGNOSIS — Z01812 Encounter for preprocedural laboratory examination: Secondary | ICD-10-CM | POA: Insufficient documentation

## 2020-01-29 DIAGNOSIS — Z20822 Contact with and (suspected) exposure to covid-19: Secondary | ICD-10-CM | POA: Diagnosis not present

## 2020-01-29 LAB — SARS CORONAVIRUS 2 (TAT 6-24 HRS): SARS Coronavirus 2: NEGATIVE

## 2020-01-30 ENCOUNTER — Encounter
Admission: RE | Disposition: A | Discharge status: Home / Self Care | Payer: Self-pay | Source: Home / Self Care | Attending: Gastroenterology

## 2020-01-30 ENCOUNTER — Ambulatory Visit
Admission: RE | Admit: 2020-01-30 | Discharge: 2020-01-30 | Disposition: A | Discharge status: Home / Self Care | Payer: BC Managed Care – PPO | Attending: Gastroenterology | Admitting: Gastroenterology

## 2020-01-30 ENCOUNTER — Ambulatory Visit: Payer: BC Managed Care – PPO | Admitting: Anesthesiology

## 2020-01-30 ENCOUNTER — Encounter: Payer: Self-pay | Admitting: Gastroenterology

## 2020-01-30 DIAGNOSIS — G43909 Migraine, unspecified, not intractable, without status migrainosus: Secondary | ICD-10-CM | POA: Insufficient documentation

## 2020-01-30 DIAGNOSIS — K635 Polyp of colon: Secondary | ICD-10-CM

## 2020-01-30 DIAGNOSIS — G4730 Sleep apnea, unspecified: Secondary | ICD-10-CM | POA: Diagnosis not present

## 2020-01-30 DIAGNOSIS — I10 Essential (primary) hypertension: Secondary | ICD-10-CM | POA: Diagnosis not present

## 2020-01-30 DIAGNOSIS — K5730 Diverticulosis of large intestine without perforation or abscess without bleeding: Secondary | ICD-10-CM | POA: Insufficient documentation

## 2020-01-30 DIAGNOSIS — Z79899 Other long term (current) drug therapy: Secondary | ICD-10-CM | POA: Insufficient documentation

## 2020-01-30 DIAGNOSIS — Z1211 Encounter for screening for malignant neoplasm of colon: Secondary | ICD-10-CM | POA: Diagnosis not present

## 2020-01-30 DIAGNOSIS — Z860100 Personal history of colon polyps, unspecified: Secondary | ICD-10-CM

## 2020-01-30 DIAGNOSIS — Z7984 Long term (current) use of oral hypoglycemic drugs: Secondary | ICD-10-CM | POA: Diagnosis not present

## 2020-01-30 DIAGNOSIS — E119 Type 2 diabetes mellitus without complications: Secondary | ICD-10-CM | POA: Insufficient documentation

## 2020-01-30 HISTORY — PX: COLONOSCOPY WITH PROPOFOL: SHX5780

## 2020-01-30 HISTORY — DX: Sleep apnea, unspecified: G47.30

## 2020-01-30 LAB — GLUCOSE, CAPILLARY: Glucose-Capillary: 121 mg/dL — ABNORMAL HIGH (ref 70–99)

## 2020-01-30 SURGERY — COLONOSCOPY WITH PROPOFOL
Anesthesia: General

## 2020-01-30 MED ORDER — SODIUM CHLORIDE 0.9 % IV SOLN: INTRAVENOUS | Status: DC

## 2020-01-30 MED ORDER — PROPOFOL 10 MG/ML IV BOLUS
INTRAVENOUS | Status: DC | PRN
  Administered 2020-01-30: 80 mg via INTRAVENOUS

## 2020-01-30 MED ORDER — PROPOFOL 500 MG/50ML IV EMUL
INTRAVENOUS | Status: DC | PRN
  Administered 2020-01-30: 110 ug/kg/min via INTRAVENOUS

## 2020-01-30 NOTE — Transfer of Care (Signed)
Immediate Anesthesia Transfer of Care Note  Patient: Tyler Colon  Procedure(s) Performed: COLONOSCOPY WITH PROPOFOL (N/A )  Patient Location: PACU  Anesthesia Type:General  Level of Consciousness: alert  and drowsy  Airway & Oxygen Therapy: Patient Spontanous Breathing  Post-op Assessment: Report given to RN and Post -op Vital signs reviewed and stable  Post vital signs: Reviewed and stable  Last Vitals:  Vitals Value Taken Time  BP 152/87 01/30/20 1117  Temp 36.2 C 01/30/20 1117  Pulse 91 01/30/20 1118  Resp 18 01/30/20 1118  SpO2 96 % 01/30/20 1118  Vitals shown include unvalidated device data.  Last Pain:  Vitals:   01/30/20 1117  TempSrc: Temporal  PainSc: 0-No pain         Complications: No apparent anesthesia complications

## 2020-01-30 NOTE — Anesthesia Preprocedure Evaluation (Signed)
Anesthesia Evaluation  Patient identified by MRN, date of birth, ID band Patient awake    Reviewed: Allergy & Precautions, NPO status , Patient's Chart, lab work & pertinent test results  History of Anesthesia Complications Negative for: history of anesthetic complications  Airway Mallampati: III       Dental   Pulmonary sleep apnea and Continuous Positive Airway Pressure Ventilation , neg COPD, Not current smoker,           Cardiovascular hypertension, Pt. on medications (-) Past MI and (-) CHF (-) dysrhythmias + Valvular Problems/Murmurs (murmur, no tx)      Neuro/Psych neg Seizures Anxiety    GI/Hepatic Neg liver ROS, GERD  ,  Endo/Other  diabetes, Type 2, Oral Hypoglycemic Agents  Renal/GU negative Renal ROS     Musculoskeletal   Abdominal   Peds  Hematology   Anesthesia Other Findings   Reproductive/Obstetrics                             Anesthesia Physical Anesthesia Plan  ASA: III  Anesthesia Plan: General   Post-op Pain Management:    Induction: Intravenous  PONV Risk Score and Plan: 2 and Propofol infusion and TIVA  Airway Management Planned: Nasal Cannula  Additional Equipment:   Intra-op Plan:   Post-operative Plan:   Informed Consent: I have reviewed the patients History and Physical, chart, labs and discussed the procedure including the risks, benefits and alternatives for the proposed anesthesia with the patient or authorized representative who has indicated his/her understanding and acceptance.       Plan Discussed with:   Anesthesia Plan Comments:         Anesthesia Quick Evaluation

## 2020-01-30 NOTE — Anesthesia Postprocedure Evaluation (Signed)
Anesthesia Post Note  Patient: Tyler Colon  Procedure(s) Performed: COLONOSCOPY WITH PROPOFOL (N/A )  Patient location during evaluation: Endoscopy Anesthesia Type: General Level of consciousness: awake and alert Pain management: pain level controlled Vital Signs Assessment: post-procedure vital signs reviewed and stable Respiratory status: spontaneous breathing and respiratory function stable Cardiovascular status: stable Anesthetic complications: no     Last Vitals:  Vitals:   01/30/20 1127 01/30/20 1137  BP: (!) 155/98 (!) 136/98  Pulse:    Resp:    Temp:    SpO2:      Last Pain:  Vitals:   01/30/20 1137  TempSrc:   PainSc: 0-No pain                 [name]

## 2020-01-30 NOTE — Op Note (Signed)
[facility] Gastroenterology Patient Name: Tyler Colon Procedure Date: 01/30/2020 11:00 AM MRN: [id number] Account #: [id number] Date of Birth: [birth year] Admit Type: Outpatient Age: 49 Room: [facility] ENDO ROOM 4 Gender: Male Note Status: Finalized Procedure:             Colonoscopy Indications:           High risk colon cancer surveillance: Personal history                         of colonic polyps Providers:             Midge Minium MD, MD Referring MD:          Hillery Aldo MD, MD (Referring MD) Medicines:             Propofol per Anesthesia Complications:         No immediate complications. Procedure:             Pre-Anesthesia Assessment:                        - Prior to the procedure, a History and Physical was                         performed, and patient medications and allergies were                         reviewed. The patient's tolerance of previous                         anesthesia was also reviewed. The risks and benefits                         of the procedure and the sedation options and risks                         were discussed with the patient. All questions were                         answered, and informed consent was obtained. Prior                         Anticoagulants: The patient has taken no previous                         anticoagulant or antiplatelet agents. ASA Grade                         Assessment: II - A patient with mild systemic disease.                         After reviewing the risks and benefits, the patient                         was deemed in satisfactory condition to undergo the                         procedure.  After obtaining informed consent, the colonoscope was                         passed under direct vision. Throughout the procedure,                         the patient's blood pressure, pulse, and oxygen                         saturations were monitored continuously. The              Colonoscope was introduced through the anus and                         advanced to the the cecum, identified by appendiceal                         orifice and ileocecal valve. The colonoscopy was                         performed without difficulty. The patient tolerated                         the procedure well. The quality of the bowel                         preparation was excellent. Findings:      The perianal and digital rectal examinations were normal.      A few small-mouthed diverticula were found in the sigmoid colon. Impression:            - Diverticulosis in the sigmoid colon.                        - No specimens collected. Recommendation:        - Discharge patient to home.                        - Resume previous diet.                        - Continue present medications.                        - Repeat colonoscopy in 5 years for surveillance. Procedure Code(s):     --- Professional ---                        [phone], Colonoscopy, flexible; diagnostic, including                         collection of specimen(s) by brushing or washing, when                         performed (separate procedure) Diagnosis Code(s):     --- Professional ---                        Z86.010, Personal history of colonic polyps CPT copyright 2019 American Medical Association. All rights reserved. The codes documented in this report are preliminary and upon coder review may  be  revised to meet current compliance requirements. Midge Minium MD, MD 01/30/2020 11:15:16 AM This report has been signed electronically. Number of Addenda: 0 Note Initiated On: 01/30/2020 11:00 AM Scope Withdrawal Time: 0 hours 6 minutes 43 seconds  Total Procedure Duration: 0 hours 8 minutes 11 seconds  Estimated Blood Loss:  Estimated blood loss: none.      [facility]

## 2020-01-30 NOTE — H&P (Signed)
Midge Minium, MD [facility] [address] [place], [place] [address] Phone:[phone] Fax : [phone]  Primary Care Physician:  Hillery Aldo, MD Primary Gastroenterologist:  Dr. Servando Snare  Pre-Procedure History & Physical: HPI:  Tyler Colon is a 49 y.o. male is here for an colonoscopy.   Past Medical History:  Diagnosis Date  . Chronic migraine   . Diabetes mellitus without complication (HCC)   . Diverticulitis   . GERD (gastroesophageal reflux disease)   . Heart murmur   . Hypertension   . Sleep apnea     Past Surgical History:  Procedure Laterality Date  . APPENDECTOMY    . COLONOSCOPY  12/13/2014   Repeat colonoscopy 12/2019 - Dr. Midge Minium, [facility]  . VASECTOMY  2002    Prior to Admission medications   Medication Sig Start Date End Date Taking? Authorizing Provider  atorvastatin (LIPITOR) 20 MG tablet Take 20 mg by mouth daily. 12/29/19   [provider]  glucose blood (PRECISION QID TEST) test strip Use 1 each (1 strip total) once daily for 90 days 11/15/18   [provider]  Lancets Misc. (UNISTIK 2 NORMAL) MISC Use 1 each once daily for 90 days 11/15/18   [provider]  losartan (COZAAR) 50 MG tablet Take 50 mg by mouth daily. 12/29/19   [provider]  metFORMIN (GLUCOPHAGE-XR) 500 MG 24 hr tablet Take 500 mg by mouth daily.  07/13/18   [provider]  metoprolol succinate (TOPROL-XL) 50 MG 24 hr tablet Take 50 mg by mouth daily. 11/30/19   [provider]  sildenafil (REVATIO) 20 MG tablet TAKE 1 2 TABLETS (20 40 MG TOTAL) BY MOUTH ONCE DAILY AS NEEDED (PRIOR TO INTERCOURSE) 11/28/19   [provider]  SUMAtriptan (IMITREX) 100 MG tablet Take 1 tablet (100 mg total) by mouth daily as needed for migraine. May repeat in 2 hours if headache persists or recurs. Patient not taking: Reported on 01/10/2020 11/18/15   Ellyn Hack, MD    Allergies as of 01/10/2020 - Review Complete 01/10/2020    Allergen Reaction Noted  . Lisinopril Rash 07/13/2018    Family History  Problem Relation Age of Onset  . Hypertension Mother   . Diabetes Mother   . Cancer Father        Lung cancer  . Diabetes Father   . Heart failure Father   . Hypertension Maternal Aunt   . Cancer Maternal Aunt        Breast Cancer  . Hypertension Maternal Grandmother   . Diabetes Maternal Grandmother   . Cancer Maternal Grandfather        Lung Cancer    Social History   Socioeconomic History  . Marital status: Married    Spouse name: Not on file  . Number of children: 2  . Years of education: Not on file  . Highest education level: Not on file  Occupational History  . Occupation: Employed/ Full time    Employer: [employer]  Tobacco Use  . Smoking status: Never Smoker  . Smokeless tobacco: Never Used  Substance and Sexual Activity  . Alcohol use: Yes    Alcohol/week: 0.0 standard drinks    Comment: Occasional alcohol use  . Drug use: No  . Sexual activity: Not on file  Other Topics Concern  . Not on file  Social History Narrative  . Not on file   Social Determinants of Health   Financial Resource Strain:   .  Difficulty of Paying Living Expenses:   Food Insecurity:   . Worried About Charity fundraiser in the Last Year:   . Arboriculturist in the Last Year:   Transportation Needs:   . Film/video editor (Medical):   Marland Kitchen Lack of Transportation (Non-Medical):   Physical Activity:   . Days of Exercise per Week:   . Minutes of Exercise per Session:   Stress:   . Feeling of Stress :   Social Connections:   . Frequency of Communication with Friends and Family:   . Frequency of Social Gatherings with Friends and Family:   . Attends Religious Services:   . Active Member of Clubs or Organizations:   . Attends Archivist Meetings:   Marland Kitchen Marital Status:   Intimate Partner Violence:   . Fear of Current or Ex-Partner:   . Emotionally Abused:   Marland Kitchen Physically Abused:   . Sexually  Abused:     Review of Systems: See HPI, otherwise negative ROS  Physical Exam: BP (!) 167/110   Pulse 72   Resp 16   Ht 5\' 6"  (1.676 m)   Wt 91.6 kg   SpO2 98%   BMI 32.60 kg/m  General:   Alert,  pleasant and cooperative in NAD Head:  Normocephalic and atraumatic. Neck:  Supple; no masses or thyromegaly. Lungs:  Clear throughout to auscultation.    Heart:  Regular rate and rhythm. Abdomen:  Soft, nontender and nondistended. Normal bowel sounds, without guarding, and without rebound.   Neurologic:  Alert and  oriented x4;  grossly normal neurologically.  Impression/Plan: Tyler Colon is here for an colonoscopy to be performed for a history of adenomatous polyps on 12/13/14  Risks, benefits, limitations, and alternatives regarding  colonoscopy have been reviewed with the patient.  Questions have been answered.  All parties agreeable.   Lucilla Lame, MD  01/30/2020, 11:18 AM

## 2020-01-31 ENCOUNTER — Encounter: Payer: Self-pay | Admitting: *Deleted

## 2020-02-02 ENCOUNTER — Ambulatory Visit: Payer: BC Managed Care – PPO | Attending: Internal Medicine

## 2020-02-02 ENCOUNTER — Other Ambulatory Visit: Payer: Self-pay

## 2020-02-02 DIAGNOSIS — Z23 Encounter for immunization: Secondary | ICD-10-CM

## 2020-02-02 NOTE — Progress Notes (Signed)
   Covid-19 Vaccination Clinic  Name:  JOAKIM HUESMAN    MRN: [id number] DOB: 31-Aug-1971  02/02/2020  Mr. Ruscitti was observed post Covid-19 immunization for 15 minutes without incident. He was provided with Vaccine Information Sheet and instruction to access the V-Safe system.   Mr. Pickard was instructed to call 911 with any severe reactions post vaccine: Marland Kitchen Difficulty breathing  . Swelling of face and throat  . A fast heartbeat  . A bad rash all over body  . Dizziness and weakness   Immunizations Administered    Name Date Dose VIS Date Route   Pfizer COVID-19 Vaccine 02/02/2020  3:09 PM 0.3 mL 12/06/2018 Intramuscular   Manufacturer: [employer], [employer]   Lot: [id number]   NDC: [address]-5

## 2020-06-19 ENCOUNTER — Other Ambulatory Visit: Payer: Self-pay

## 2020-06-19 ENCOUNTER — Emergency Department: Payer: BC Managed Care – PPO

## 2020-06-19 ENCOUNTER — Emergency Department
Admission: EM | Admit: 2020-06-19 | Discharge: 2020-06-19 | Disposition: A | Discharge status: Home / Self Care | Payer: BC Managed Care – PPO | Attending: Emergency Medicine | Admitting: Emergency Medicine

## 2020-06-19 ENCOUNTER — Encounter: Payer: Self-pay | Admitting: Emergency Medicine

## 2020-06-19 DIAGNOSIS — R0789 Other chest pain: Secondary | ICD-10-CM | POA: Diagnosis present

## 2020-06-19 DIAGNOSIS — I509 Heart failure, unspecified: Secondary | ICD-10-CM | POA: Insufficient documentation

## 2020-06-19 DIAGNOSIS — E119 Type 2 diabetes mellitus without complications: Secondary | ICD-10-CM | POA: Insufficient documentation

## 2020-06-19 DIAGNOSIS — Z7984 Long term (current) use of oral hypoglycemic drugs: Secondary | ICD-10-CM | POA: Insufficient documentation

## 2020-06-19 DIAGNOSIS — Z20822 Contact with and (suspected) exposure to covid-19: Secondary | ICD-10-CM | POA: Diagnosis not present

## 2020-06-19 DIAGNOSIS — Z79899 Other long term (current) drug therapy: Secondary | ICD-10-CM | POA: Insufficient documentation

## 2020-06-19 DIAGNOSIS — R079 Chest pain, unspecified: Secondary | ICD-10-CM

## 2020-06-19 DIAGNOSIS — I110 Hypertensive heart disease with heart failure: Secondary | ICD-10-CM | POA: Insufficient documentation

## 2020-06-19 DIAGNOSIS — G44209 Tension-type headache, unspecified, not intractable: Secondary | ICD-10-CM | POA: Insufficient documentation

## 2020-06-19 LAB — CBC
HCT: 41.5 % (ref 39.0–52.0)
Hemoglobin: 14.6 g/dL (ref 13.0–17.0)
MCH: 30.1 pg (ref 26.0–34.0)
MCHC: 35.2 g/dL (ref 30.0–36.0)
MCV: 85.6 fL (ref 80.0–100.0)
Platelets: 303 10*3/uL (ref 150–400)
RBC: 4.85 MIL/uL (ref 4.22–5.81)
RDW: 13.2 % (ref 11.5–15.5)
WBC: 7.1 10*3/uL (ref 4.0–10.5)
nRBC: 0 % (ref 0.0–0.2)

## 2020-06-19 LAB — BASIC METABOLIC PANEL
Anion gap: 11 (ref 5–15)
BUN: 12 mg/dL (ref 6–20)
CO2: 26 mmol/L (ref 22–32)
Calcium: 9.1 mg/dL (ref 8.9–10.3)
Chloride: 101 mmol/L (ref 98–111)
Creatinine, Ser: 0.92 mg/dL (ref 0.61–1.24)
GFR calc Af Amer: >60 mL/min (ref >60)
GFR calc non Af Amer: >60 mL/min (ref >60)
Glucose, Bld: 197 mg/dL — ABNORMAL HIGH (ref 70–99)
Potassium: 3.5 mmol/L (ref 3.5–5.1)
Sodium: 138 mmol/L (ref 135–145)

## 2020-06-19 LAB — TROPONIN I (HIGH SENSITIVITY)
Troponin I (High Sensitivity): 11 ng/L (ref <18)
Troponin I (High Sensitivity): 11 ng/L (ref <18)

## 2020-06-19 LAB — SARS CORONAVIRUS 2 BY RT PCR (HOSPITAL ORDER, PERFORMED IN ~~LOC~~ HOSPITAL LAB): SARS Coronavirus 2: NEGATIVE

## 2020-06-19 MED ORDER — HYDROCHLOROTHIAZIDE 25 MG PO TABS: 25.0000 mg | ORAL_TABLET | Freq: Every day | ORAL | 1 refills | Status: AC

## 2020-06-19 MED ORDER — CYCLOBENZAPRINE HCL 10 MG PO TABS: 10.0000 mg | ORAL_TABLET | Freq: Every day | ORAL | 0 refills | Status: DC

## 2020-06-19 NOTE — ED Triage Notes (Signed)
Arrives c/o chest tightness, high blood pressure and headache x several weeks.  Has seen Cardiology for same.    AAOx3. Skin warm and dry. NAD

## 2020-06-19 NOTE — Discharge Instructions (Addendum)
Follow-up with your cardiologist for the intermittent chest pain.  Return emergency department if worsening. For the tension headache take Tylenol and the Flexeril.  Do not drive or use heavy equipment while taking this medication as it will make you drowsy.

## 2020-06-19 NOTE — ED Provider Notes (Signed)
[facility] Emergency Department Provider Note  ____________________________________________   First MD Initiated Contact with Patient 06/19/20 1356     (approximate)  I have reviewed the triage vital signs and the nursing notes.   HISTORY  Chief Complaint Chest Pain    HPI Tyler Colon is a 49 y.o. male with history of chronic migraines, diabetes, heart murmur, and hypertension along with sleep apnea presents complaining of chest pain, chest tightness, high blood pressure, and headache for several weeks.  No chest pain at this time.  States he felt like he was weaker on his left side but no slurred speech or other strokelike symptoms.  He is followed by cardiology at [facility].  He was seen last week and will be rechecked in 2 weeks.   He is not using his CPAP machine as he does not have a mask at this time.   Past Medical History:  Diagnosis Date  . Chronic migraine   . Diabetes mellitus without complication (HCC)   . Diverticulitis   . GERD (gastroesophageal reflux disease)   . Heart murmur   . Hypertension   . Sleep apnea     Patient Active Problem List   Diagnosis Date Noted  . Personal history of colonic polyps   . Erectile dysfunction 08/22/2018  . Obesity (BMI 30-39.9) 08/22/2018  . Sleep apnea 08/22/2018  . Diabetes mellitus, type 2 (HCC) 06/12/2018  . IGT (impaired glucose tolerance) 03/12/2017  . Vitamin D deficiency 03/12/2017  . Essential hypertension 01/20/2017  . Cerumen impaction 11/18/2015  . Nasal sinus congestion 08/26/2015  . Sore throat 06/06/2015  . Anxiety 05/30/2015  . CHF (congestive heart failure) (HCC) 05/30/2015  . Chest pain 05/29/2015  . Diarrhea in adult patient 05/02/2015  . Elevated PSA 04/18/2015  . Elevated hemoglobin A1c measurement 04/18/2015  . Right ear impacted cerumen 04/18/2015  . Abdominal pain 04/16/2015  . Abnormal LFTs 04/16/2015  . Back ache 04/16/2015  . Encounter for general  adult medical examination without abnormal findings 04/16/2015  . Dyslipidemia 04/16/2015  . Encounter for screening for malignant neoplasm of prostate 04/16/2015  . Migraine with aura and without status migrainosus, not intractable 04/16/2015  . Cannot sleep 04/16/2015  . 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 Blood glucose elevated 04/16/2015  . Undiagnosed cardiac murmurs 04/16/2015  . Fatigue 04/16/2015  . Family history of diabetes mellitus 04/16/2015  . Abnormal prostate specific antigen 04/16/2015  . Ear ache 04/16/2015    Past Surgical History:  Procedure Laterality Date  . APPENDECTOMY    . COLONOSCOPY  12/13/2014   Repeat colonoscopy 12/2019 - Dr. Midge Minium, [facility]  . COLONOSCOPY WITH PROPOFOL N/A 01/30/2020   Procedure: COLONOSCOPY WITH PROPOFOL;  Surgeon: Midge Minium, MD;  Location: [facility] ENDOSCOPY;  Service: Endoscopy;  Laterality: N/A;  . VASECTOMY  2002    Prior to Admission medications   Medication Sig Start Date End Date Taking? Authorizing Provider  atorvastatin (LIPITOR) 20 MG tablet Take 20 mg by mouth daily. 12/29/19   [provider]  cyclobenzaprine (FLEXERIL) 10 MG tablet Take 1 tablet (10 mg total) by mouth at bedtime. 06/19/20   Stephen Baruch, Roselyn Bering, PA-C  glucose blood (PRECISION QID TEST) test strip Use 1 each (1 strip total) once daily for 90 days 11/15/18   [provider]  hydrochlorothiazide (HYDRODIURIL) 25 MG tablet Take 1 tablet (25 mg total) by mouth daily. 06/19/20   Adriona Kaney, Roselyn Bering, PA-C  Lancets Misc. Roma Kayser 2 NORMAL) MISC Use  1 each once daily for 90 days 11/15/18   [provider]  losartan (COZAAR) 50 MG tablet Take 50 mg by mouth daily. 12/29/19   [provider]  metFORMIN (GLUCOPHAGE-XR) 500 MG 24 hr tablet Take 500 mg by mouth daily.  07/13/18   [provider]  metoprolol succinate (TOPROL-XL) 50 MG 24 hr tablet Take 50 mg by mouth daily. 11/30/19   [provider]  sildenafil (REVATIO) 20 MG tablet TAKE  1 2 TABLETS (20 40 MG TOTAL) BY MOUTH ONCE DAILY AS NEEDED (PRIOR TO INTERCOURSE) 11/28/19   [provider]    Allergies Lisinopril  Family History  Problem Relation Age of Onset  . Hypertension Mother   . Diabetes Mother   . Cancer Father        Lung cancer  . Diabetes Father   . Heart failure Father   . Hypertension Maternal Aunt   . Cancer Maternal Aunt        Breast Cancer  . Hypertension Maternal Grandmother   . Diabetes Maternal Grandmother   . Cancer Maternal Grandfather        Lung Cancer    Social History Social History   Tobacco Use  . Smoking status: Never Smoker  . Smokeless tobacco: Never Used  Vaping Use  . Vaping Use: Never used  Substance Use Topics  . Alcohol use: Yes    Alcohol/week: 0.0 standard drinks    Comment: Occasional alcohol use  . Drug use: No    Review of Systems  Constitutional: No fever/chills Eyes: No visual changes. ENT: No sore throat. Respiratory: Denies cough Cardiovascular: Positive chest pain/tightness Gastrointestinal: Denies abdominal pain Genitourinary: Negative for dysuria. Musculoskeletal: Negative for back pain. Skin: Negative for rash. Psychiatric: no mood changes,     ____________________________________________   PHYSICAL EXAM:  VITAL SIGNS: ED Triage Vitals  Enc Vitals Group     BP 06/19/20 1143 (!) 158/84     Pulse Rate 06/19/20 1143 87     Resp 06/19/20 1143 (!) 22     Temp 06/19/20 1143 98.9 F (37.2 C)     Temp Source 06/19/20 1143 Oral     SpO2 06/19/20 1143 98 %     Weight 06/19/20 1129 201 lb 15.1 oz (91.6 kg)     Height 06/19/20 1129 5\' 6"  (1.676 m)     Head Circumference --      Peak Flow --      Pain Score 06/19/20 1129 5     Pain Loc --      Pain Edu? --      Excl. in GC? --     Constitutional: Alert and oriented. Well appearing and in no acute distress. Eyes: Conjunctivae are normal.  Head: Atraumatic. Nose: No congestion/rhinnorhea. Mouth/Throat: Mucous membranes are  moist.   Neck:  supple no lymphadenopathy noted Cardiovascular: Normal rate, regular rhythm. Heart sounds are normal Respiratory: Normal respiratory effort.  No retractions, lungs c t a  Abd: soft nontender bs normal all 4 quad GU: deferred Musculoskeletal: FROM all extremities, warm and well perfused Neurologic:  Normal speech and language.  Cranial nerves II through XII grossly intact, no facial droop was noted, grips are equal bilaterally Skin:  Skin is warm, dry and intact. No rash noted. Psychiatric: Mood and affect are normal. Speech and behavior are normal.  ____________________________________________   LABS (all labs ordered are listed, but only abnormal results are displayed)  Labs Reviewed  BASIC METABOLIC PANEL - Abnormal; Notable  for the following components:      Result Value   Glucose, Bld 197 (*)    All other components within normal limits  SARS CORONAVIRUS 2 BY RT PCR (HOSPITAL ORDER, PERFORMED IN [facility])  CBC  TROPONIN I (HIGH SENSITIVITY)  TROPONIN I (HIGH SENSITIVITY)   ____________________________________________   ____________________________________________  RADIOLOGY  CT of the head is negative  ____________________________________________   PROCEDURES  Procedure(s) performed: No  Procedures    ____________________________________________   INITIAL IMPRESSION / ASSESSMENT AND PLAN / ED COURSE  Pertinent labs & imaging results that were available during my care of the patient were reviewed by me and considered in my medical decision making (see chart for details).   Patient is a 49 year old male presents emergency department with headache and chest pain for a few weeks.  Has seen cardiology in the past week for the same.  See HPI  Physical exam shows patient to appear stable.  Blood pressure elevated 150/80.  Right side of skull is tender to palpation, C-spine is nontender but the trapezius supraspinatus muscles are  spasmed bilaterally.  Remainder of exam is unremarkable  DDx: Migraine, nonspecific chest pain, Covid hypertension  CBC is normal, basic metabolic panel has elevated glucose of 197, troponin is normal at 11, chest x-ray is normal  CT of the head is negative, Covid test negative  Did explain the findings to the patient.  Feel like he has more of a tension headache at this time.  He is to follow-up with Dr. Darrold Junker concerning his intermittent chest pain.  With his blood pressure being elevated I did add HCTZ as he takes metoprolol.  He can also discuss his elevated blood pressure readings with his cardiologist.  Return emergency department if worsening.  He was discharged stable condition.      Tyler Colon was evaluated in Emergency Department on 06/19/2020 for the symptoms described in the history of present illness. He was evaluated in the context of the global COVID-19 pandemic, which necessitated consideration that the patient might be at risk for infection with the SARS-CoV-2 virus that causes COVID-19. Institutional protocols and algorithms that pertain to the evaluation of patients at risk for COVID-19 are in a state of rapid change based on information released by regulatory bodies including the CDC and federal and state organizations. These policies and algorithms were followed during the patient's care in the ED.    As part of my medical decision making, I reviewed the following data within the electronic MEDICAL RECORD NUMBER Nursing notes reviewed and incorporated, Labs reviewed , EKG interpreted NSR, see physician read, Old EKG reviewed, Old chart reviewed, Radiograph reviewed , Notes from prior ED visits and [place] Controlled Substance Database  ____________________________________________   FINAL CLINICAL IMPRESSION(S) / ED DIAGNOSES  Final diagnoses:  Tension headache  Nonspecific chest pain      NEW MEDICATIONS STARTED DURING THIS VISIT:  New Prescriptions   CYCLOBENZAPRINE  (FLEXERIL) 10 MG TABLET    Take 1 tablet (10 mg total) by mouth at bedtime.   HYDROCHLOROTHIAZIDE (HYDRODIURIL) 25 MG TABLET    Take 1 tablet (25 mg total) by mouth daily.     Note:  This document was prepared using Dragon voice recognition software and may include unintentional dictation errors.    Faythe Ghee, PA-C 06/19/20 1632    Delton Prairie, MD 06/19/20 2046

## 2020-09-12 ENCOUNTER — Other Ambulatory Visit: Payer: Self-pay

## 2020-09-12 ENCOUNTER — Ambulatory Visit (INDEPENDENT_AMBULATORY_CARE_PROVIDER_SITE_OTHER): Payer: BC Managed Care – PPO | Admitting: Dermatology

## 2020-09-12 DIAGNOSIS — L820 Inflamed seborrheic keratosis: Secondary | ICD-10-CM

## 2020-09-12 DIAGNOSIS — L821 Other seborrheic keratosis: Secondary | ICD-10-CM

## 2020-09-12 DIAGNOSIS — W57XXXA Bitten or stung by nonvenomous insect and other nonvenomous arthropods, initial encounter: Secondary | ICD-10-CM

## 2020-09-12 DIAGNOSIS — S1086XA Insect bite of other specified part of neck, initial encounter: Secondary | ICD-10-CM | POA: Diagnosis not present

## 2020-09-12 DIAGNOSIS — L219 Seborrheic dermatitis, unspecified: Secondary | ICD-10-CM

## 2020-09-12 MED ORDER — KETOCONAZOLE 2 % EX CREA: 1.0000 "application " | TOPICAL_CREAM | CUTANEOUS | 3 refills | Status: AC

## 2020-09-12 NOTE — Progress Notes (Signed)
   New Patient Visit  Subjective  Tyler Colon is a 49 y.o. male who presents for the following: Rash (perinasal, >23yrs itches, burns, using lotion) and Nevus (R cheek, irritating).  The following portions of the chart were reviewed this encounter and updated as appropriate:  Tobacco  Allergies  Meds  Problems  Med Hx  Surg Hx  Fam Hx     Review of Systems:  No other skin or systemic complaints except as noted in HPI or Assessment and Plan.  Objective  Well appearing patient in no apparent distress; mood and affect are within normal limits.  A focused examination was performed including face. Relevant physical exam findings are noted in the Assessment and Plan.  Objective  face: Pink patches with greasy scale.   Objective  Head - Anterior (Face): Erythematous keratotic or waxy stuck-on papule 0.5cm  Objective  L neck: Scaly patch   Assessment & Plan   Seborrheic Keratoses - Stuck-on, waxy, tan-brown papules and plaques  - Discussed benign etiology and prognosis. - Observe - Call for any changes  Seborrheic dermatitis face  Seborrheic Dermatitis  -  is a chronic persistent rash characterized by pinkness and scaling most commonly of the mid face but also can occur on the scalp (dandruff); mid chest and mid back. It tends to be exacerbated by stress and cooler weather.  People who have neurologic disease may experience new onset or exacerbation of existing seborrheic dermatitis.  The condition is not curable but treatable and can be controlled.  Start Ketoconazole 2% cr 3d/wk hs, Monday, Wednesday and Friday  ketoconazole (NIZORAL) 2 % cream - face  Inflamed seborrheic keratosis Head - Anterior (Face)  Discussed treatment options  ED x 1 today  Discussed risk of hyper/hypopigmentation at txt site  Destruction of lesion - Head - Anterior (Face) Complexity: simple   Destruction method comment:  Electrodesiccation Informed consent: discussed and consent  obtained   Timeout:  patient name, date of birth, surgical site, and procedure verified Patient was prepped and draped in usual sterile fashion: patient was prepped with isopropyl alcohol. Anesthesia comment:  1.0cc Outcome: patient tolerated procedure well with no complications    Bug bite without infection, initial encounter L neck  Bite Rxn vs Irritant Contact  Start Cloderm cream qd/bid sample x 1 Lot# PPAF 11/22  Return in about 3 months (around 12/11/2020) for Seb derm f/u, recheck ISK.   I, Ardis Rowan, RMA, am acting as scribe for Armida Sans, MD .  Documentation: I have reviewed the above documentation for accuracy and completeness, and I agree with the above.  Armida Sans, MD

## 2020-09-18 ENCOUNTER — Encounter: Payer: Self-pay | Admitting: Dermatology

## 2020-11-20 IMAGING — CT CT HEAD W/O CM
3 series · 16 of 47 positions shown, 19 images · non-contrast
Comparison: December 22, 2018

CLINICAL DATA: High per tension with headache and dizziness

EXAM:
CT HEAD WITHOUT CONTRAST
TECHNIQUE: Contiguous axial images were obtained from the base of the skull
through the vertex without intravenous contrast.

[Series 2: head wo · axial · 0.47mm/px · z∈[+1341,+1466]mm · 10 of 31 slices shown, 13 images]
[im 3/31  brain]
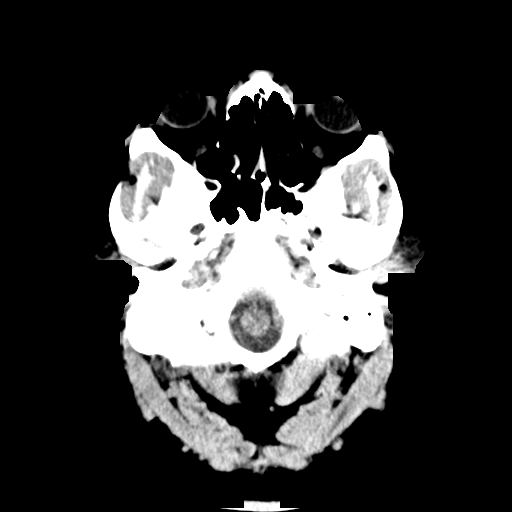
[im 3/31  bone]
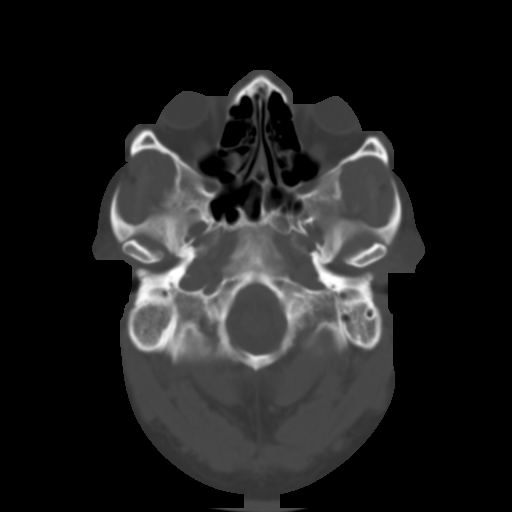
[im 6/31  brain]
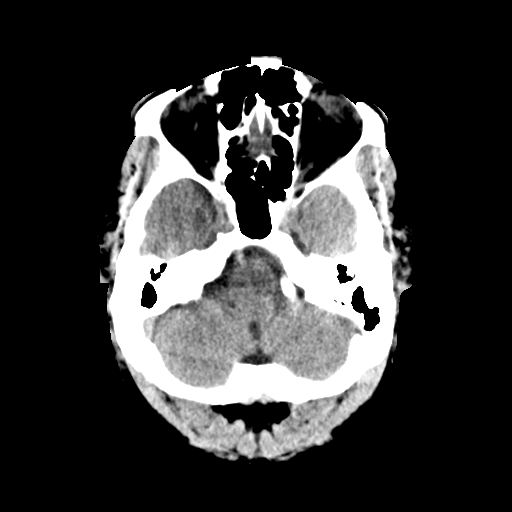
[im 9/31  brain]
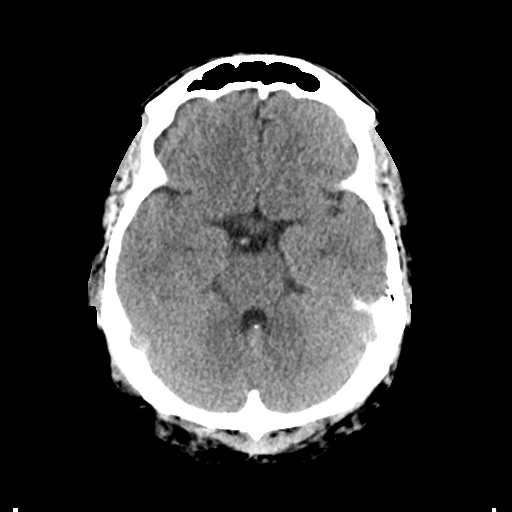
[im 11/31  brain]
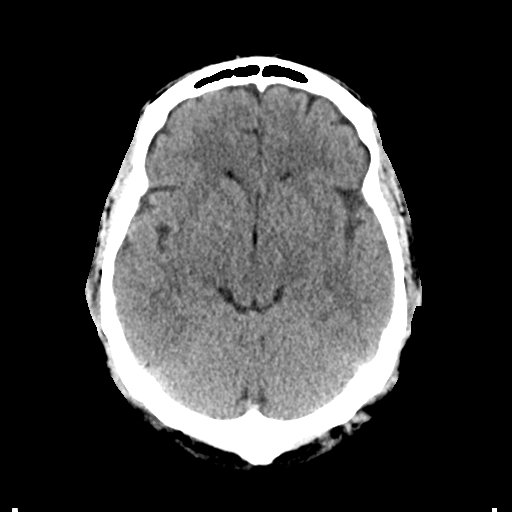
[im 14/31  brain]
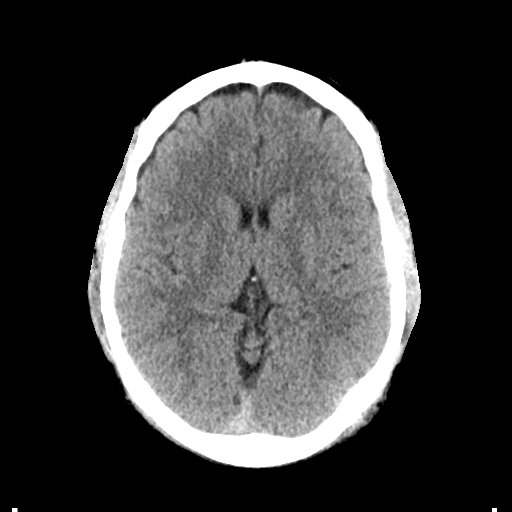
[im 14/31  bone]
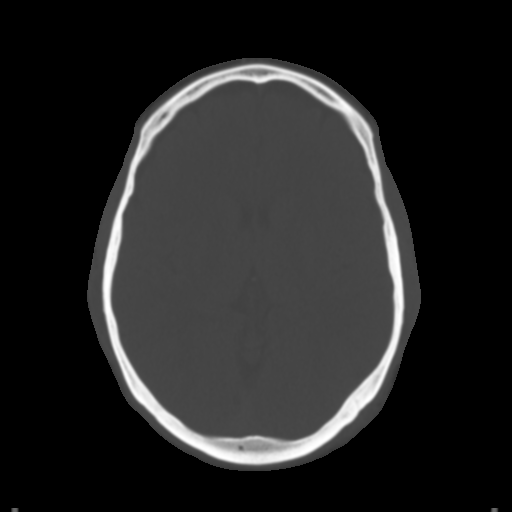
[im 17/31  brain]
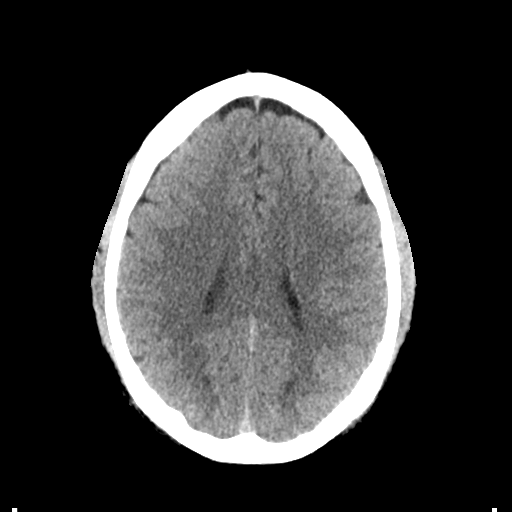
[im 20/31  brain]
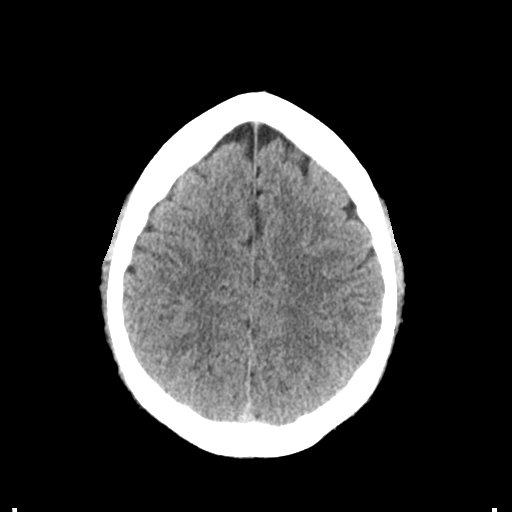
[im 23/31  brain]
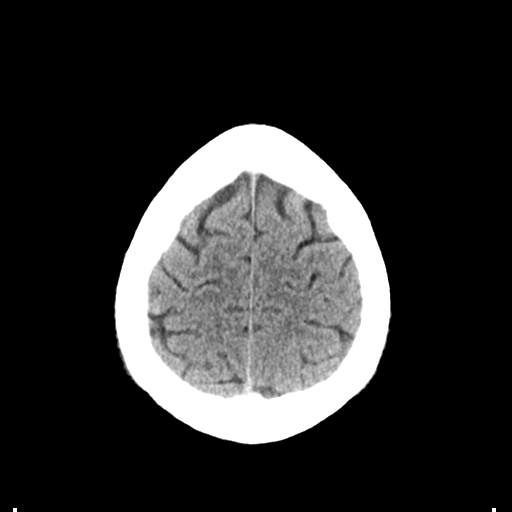
[im 25/31  brain]
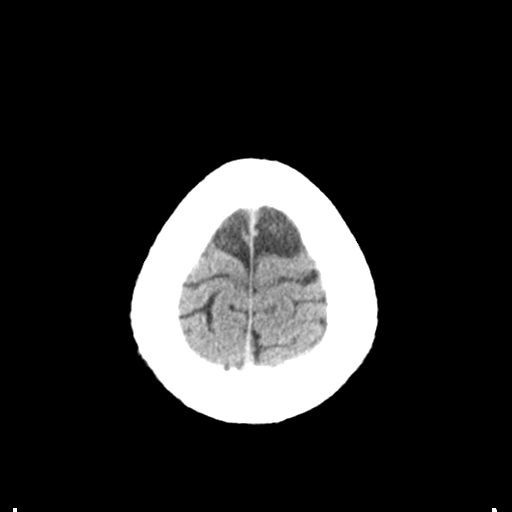
[im 25/31  bone]
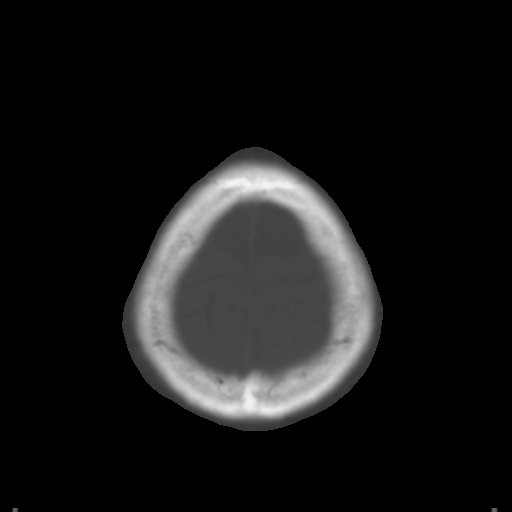
[im 28/31  brain]
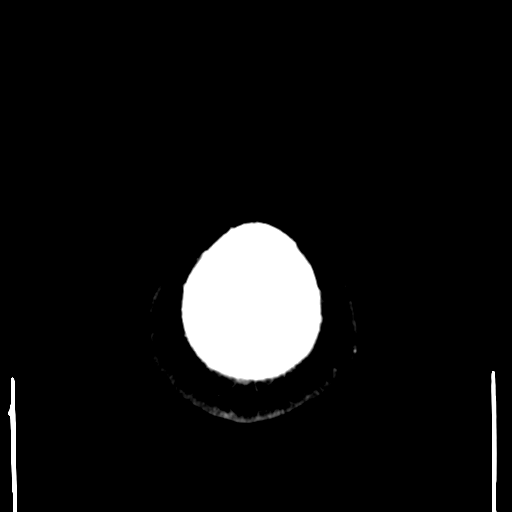

[Series 4: coronal soft tissue · coronal · 0.35mm/px · 3 of 72 slices shown]
[im 24/72  brain]
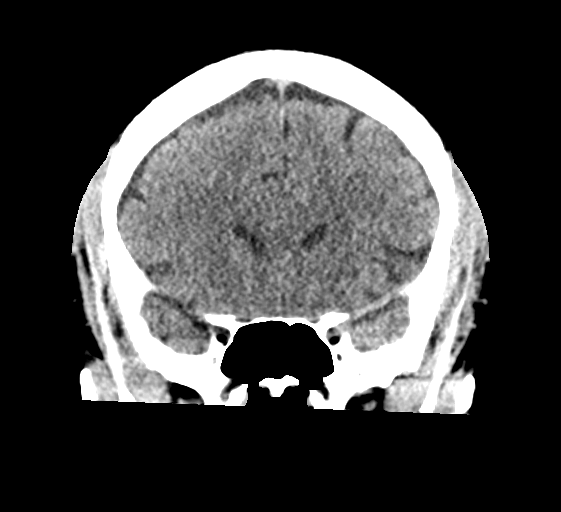
[im 32/72  brain]
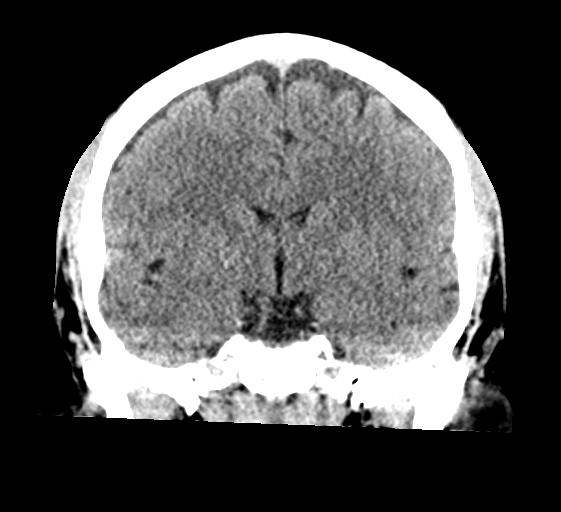
[im 40/72  brain]
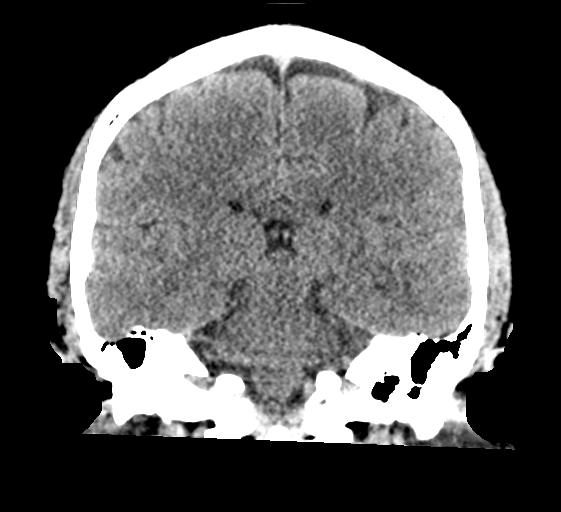

[Series 5: sagittal soft tissue · sagittal · 0.34mm/px · 3 of 67 slices shown]
[im 23/67  brain]
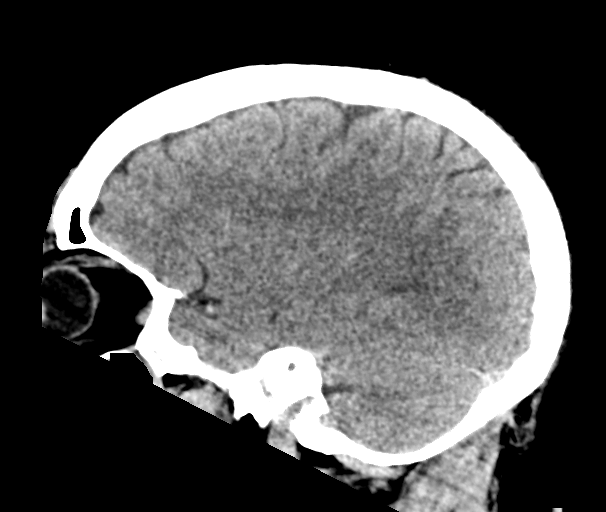
[im 34/67  brain]
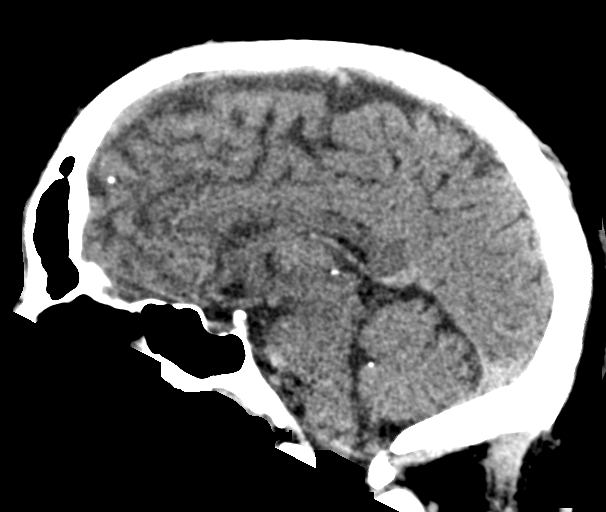
[im 45/67  brain]
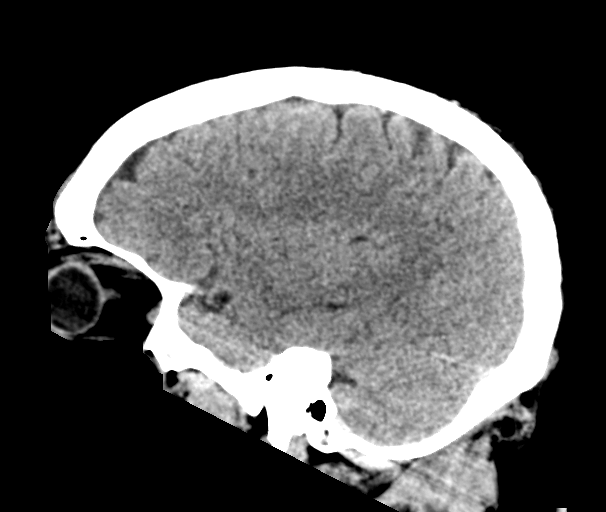

[16 of 47 positions shown; findings below may reference images not displayed]

FINDINGS: Brain: Ventricles and sulci are normal in size and configuration.
There is no intracranial mass, hemorrhage, extra-axial fluid
collection, or midline shift. Brain parenchyma appears unremarkable.
No evident acute infarct.

Vascular: No hyperdense vessel. There is minimal calcification in
the right carotid siphon region.

Skull: The bony calvarium appears intact.

Sinuses/Orbits: There is opacification in several posterior ethmoid
air cells bilaterally. Visualized orbits appear symmetric
bilaterally.

Other: Mastoid air cells are clear. Note that mastoid air cells on
the right are somewhat hypoplastic.
IMPRESSION: Brain parenchyma appears unremarkable.  No mass or hemorrhage.

Minimal arterial vascular calcification. Opacification noted in
several ethmoid air cells bilaterally.

## 2020-11-27 ENCOUNTER — Ambulatory Visit (INDEPENDENT_AMBULATORY_CARE_PROVIDER_SITE_OTHER): Payer: BC Managed Care – PPO | Admitting: Neurology

## 2020-11-27 ENCOUNTER — Encounter: Payer: Self-pay | Admitting: Neurology

## 2020-11-27 VITALS — BP 133/85 | HR 80 | Ht 65.0 in | Wt 207.0 lb

## 2020-11-27 DIAGNOSIS — G4733 Obstructive sleep apnea (adult) (pediatric): Secondary | ICD-10-CM | POA: Diagnosis not present

## 2020-11-27 DIAGNOSIS — Z789 Other specified health status: Secondary | ICD-10-CM

## 2020-11-27 DIAGNOSIS — Z87898 Personal history of other specified conditions: Secondary | ICD-10-CM | POA: Diagnosis not present

## 2020-11-27 DIAGNOSIS — G43109 Migraine with aura, not intractable, without status migrainosus: Secondary | ICD-10-CM | POA: Diagnosis not present

## 2020-11-27 DIAGNOSIS — G4730 Sleep apnea, unspecified: Secondary | ICD-10-CM

## 2020-11-27 DIAGNOSIS — G4710 Hypersomnia, unspecified: Secondary | ICD-10-CM | POA: Diagnosis not present

## 2020-11-27 NOTE — Patient Instructions (Signed)

## 2020-11-27 NOTE — Progress Notes (Signed)
SLEEP MEDICINE CLINIC    Provider:  Melvyn Novasarmen  Audry Pecina, MD  Primary Care Physician:  [facility] [address] [place] [id number]     Referring Provider: Dr Annalee GentaShoemaker, MD ENT          Chief Complaint according to patient   Patient presents with:    . New Patient (Initial Visit)     INSPIRE EVALUATION       HISTORY OF PRESENT ILLNESS:  Tyler AmorDwayne T Carre is a 50 y.o. year old African American male patient seen here as a referral on 11/27/2020 from Dr Annalee GentaShoemaker, ENT MD ,  for a pre inspire evaluation. .  Chief concern according to patient :  "He had a SS over 4936yrs ago was on CPAP machine. Then recall happened and stopped using the machine 4 months ago. He has noticed increase In daytime sleepiness since stopping.  Interested in [place]".   I have the pleasure of seeing Tyler Colon today, a right -handed [place] male with a possible sleep disorder. he  has a past medical history of Chronic migraine, Diabetes mellitus without complication (HCC), Diverticulitis, GERD (gastroesophageal reflux disease), Heart murmur, Hypertension, and Sleep apnea..    The patient had the first sleep [facility] in May and June 2017 [facility] under Dr Gwen PoundsKowalski, by ut the data and results are not available . He was titrated to CPAP, given a [employer] device which is now recalled.  Sleep relevant medical history: no ENT surgery, no neck injuries, no TBI, no asthma or COPD.  Nocturia: 2 times since off CPAP- none while under therapy.   Family medical /sleep history: No  other family member on CPAP with OSA, insomnia, sleep walkers.    Social history:  Patient is working as a Chartered loss adjustermanufacture worker and lives in a household with spouse and children,  One dog. The patient currently works in daytime - Tobacco use- none .  ETOH use ; socially,  Caffeine intake in form of Coffee( /) Soda( Mountain dew- 2 bottles a day) .    Sleep habits are as follows: The patient's dinner time is  between 6-7  PM. The patient goes to bed at 9-10 PM and continues to sleep for 5-7 hours, wakes for  bathroom breaks.    The preferred sleep position is sideways, with the support of 1 pillow.  Dreams are reportedly frequent.  5.30  AM is the usual rise time. The patient wakes up with an alarm.  He reports not feeling refreshed or restored in AM, with symptoms such as dry mouth , morning headaches, and residual fatigue.  Naps are taken frequently, lasting from 30-45 minutes and are more refreshing than nocturnal sleep.    Review of Systems: Out of a complete 14 system review, the patient complains of only the following symptoms, and all other reviewed systems are negative.:  Fatigue, sleepiness , snoring, fragmented sleep, NOCTURIA sine he stopped using CPAP. Less sleep time, more fatigue.    How likely are you to doze in the following situations: 0 = not likely, 1 = slight chance, 2 = moderate chance, 3 = high chance   Sitting and Reading? Watching Television? Sitting inactive in a public place (theater or meeting)? As a passenger in a car for an hour without a break? Lying down in the afternoon when circumstances permit? Sitting and talking to someone? Sitting quietly after lunch without alcohol? In a car, while stopped for a few minutes in traffic?  Total = 11/ 24 points   FSS endorsed at 29/ 63 points.   Social History   Socioeconomic History  . Marital status: Married    Spouse name: Not on file  . Number of children: 2  . Years of education: Not on file  . Highest education level: Not on file  Occupational History  . Occupation: Employed/ Full time    Employer: [employer]  Tobacco Use  . Smoking status: Never Smoker  . Smokeless tobacco: Never Used  Vaping Use  . Vaping Use: Never used  Substance and Sexual Activity  . Alcohol use: Yes    Alcohol/week: 0.0 standard drinks    Comment: Occasional alcohol use  . Drug use: No  . Sexual activity: Not on file  Other Topics  Concern  . Not on file  Social History Narrative  . Not on file   Social Determinants of Health   Financial Resource Strain: Not on file  Food Insecurity: Not on file  Transportation Needs: Not on file  Physical Activity: Not on file  Stress: Not on file  Social Connections: Not on file    Family History  Problem Relation Age of Onset  . Hypertension Mother   . Diabetes Mother   . Cancer Father        Lung cancer  . Diabetes Father   . Heart failure Father   . Hypertension Maternal Aunt   . Cancer Maternal Aunt        Breast Cancer  . Hypertension Maternal Grandmother   . Diabetes Maternal Grandmother   . Cancer Maternal Grandfather        Lung Cancer    Past Medical History:  Diagnosis Date  . Chronic migraine   . Diabetes mellitus without complication (HCC)   . Diverticulitis   . GERD (gastroesophageal reflux disease)   . Heart murmur   . Hypertension   . Sleep apnea     Past Surgical History:  Procedure Laterality Date  . APPENDECTOMY    . COLONOSCOPY  12/13/2014   Repeat colonoscopy 12/2019 - Dr. Midge Minium, [facility]  . COLONOSCOPY WITH PROPOFOL N/A 01/30/2020   Procedure: COLONOSCOPY WITH PROPOFOL;  Surgeon: Midge Minium, MD;  Location: [facility] ENDOSCOPY;  Service: Endoscopy;  Laterality: N/A;  . VASECTOMY  2002     Current Outpatient Medications on File Prior to Visit  Medication Sig Dispense Refill  . atorvastatin (LIPITOR) 20 MG tablet Take 20 mg by mouth daily.    . cyclobenzaprine (FLEXERIL) 10 MG tablet Take 1 tablet (10 mg total) by mouth at bedtime. 10 tablet 0  . glucose blood (PRECISION QID TEST) test strip Use 1 each (1 strip total) once daily for 90 days    . hydrochlorothiazide (HYDRODIURIL) 25 MG tablet Take 1 tablet (25 mg total) by mouth daily. 30 tablet 1  . Lancets Misc. (UNISTIK 2 NORMAL) MISC Use 1 each once daily for 90 days    . losartan (COZAAR) 50 MG tablet Take 50 mg by mouth daily.    . metFORMIN (GLUCOPHAGE-XR) 500 MG 24 hr  tablet Take 500 mg by mouth daily.     . metoprolol succinate (TOPROL-XL) 50 MG 24 hr tablet Take 50 mg by mouth daily.    . sildenafil (REVATIO) 20 MG tablet TAKE 1 2 TABLETS (20 40 MG TOTAL) BY MOUTH ONCE DAILY AS NEEDED (PRIOR TO INTERCOURSE)     No current facility-administered medications on file prior to visit.    Allergies  Allergen  Reactions  . Lisinopril Rash    Physical exam:  Today's Vitals   11/27/20 1517  BP: 133/85  Pulse: 80  Weight: 207 lb (93.9 kg)  Height: 5\' 5"  (1.651 m)   Body mass index is 34.45 kg/m.   Wt Readings from Last 3 Encounters:  11/27/20 207 lb (93.9 kg)  06/19/20 201 lb 15.1 oz (91.6 kg)  01/30/20 202 lb (91.6 kg)     Ht Readings from Last 3 Encounters:  11/27/20 5\' 5"  (1.651 m)  06/19/20 5\' 6"  (1.676 m)  01/30/20 5\' 6"  (1.676 m)      General: The patient is awake, alert and appears not in acute distress. The patient is well groomed. Head: Normocephalic, atraumatic. Neck is supple. Mallampati 3 plus ,  neck circumference:18 inches . Nasal airflow  patent.  Retrognathia is  seen.  Dental status: intact. Cardiovascular:  Regular rate and cardiac rhythm by pulse,  without distended neck veins. Respiratory: Lungs are clear to auscultation.  Skin:  Without evidence of ankle edema, or rash. Trunk: The patient's posture is erect.   Neurologic exam : The patient is awake and alert, oriented to place and time.   Memory subjective described as intact.  Attention span & concentration ability appears normal.  Speech is fluent,  without  dysarthria, dysphonia or aphasia.  Mood and affect are appropriate.   Cranial nerves: no loss of smell or taste reported  Pupils are equal and briskly reactive to light. Funduscopic exam deferred. .  Extraocular movements in vertical and horizontal planes were intact and without nystagmus. No Diplopia. Visual fields by finger perimetry are intact. Hearing was intact to soft voice and finger rubbing.     Facial sensation intact to fine touch.  Facial motor strength is symmetric and tongue and uvula move midline.  Neck ROM : rotation, tilt and flexion extension were normal for age and shoulder shrug was symmetrical.    Motor exam:  Symmetric bulk, tone and ROM.   Normal tone without cog wheeling, symmetric grip strength .   Sensory:  Fine touch, pinprick and vibration were tested  and  normal.  Sometimes both of his his hands feel stiff and swollen and asleep.  Proprioception tested in the upper extremities was normal.   Coordination: Rapid alternating movements in the fingers/hands were of normal speed.  The Finger-to-nose maneuver was intact without evidence of ataxia, dysmetria or tremor.   Gait and station: Patient could rise unassisted from a seated position, walked without assistive device.  Toe and heel walk were deferred.  Deep tendon reflexes: in the  upper and lower extremities are symmetric and intact.  Babinski response was deferred.       After spending a total time of 45 minutes face to face and additional time for physical and neurologic examination, review of laboratory studies,  personal review of imaging studies, reports and results of other testing and review of referral information / records as far as provided in visit, I have established the following assessments:  1) Unknown baseline degree of apnea , 1 2017 - needs a repeat and wants to get away from PAP therapy.  2) since apnea is now left untreated , his Epworth and fatigue scores have increased, and he has nocturia.  3) Risk factor for OSA is obesity, BMI 34.5     My Plan is to proceed with: re referral to ENT after sleep study.   1) HST and PSG are both possible screening tools for sleep apnea.  2) weight loss, weight watcher or similar to reach BMI of 32.  3) sleep hygiene instructions given.   I would like to thank Dr Annalee Genta, MD , ENT - for allowing me to meet with and to take care of this pleasant  patient.     I plan to follow up either personally or through our NP within 3-4 month.    Electronically signed by: Melvyn Novas, MD 11/27/2020 3:36 PM  [facility] and [facility] Sleep Board certified by The [employer] of Sleep Medicine and Diplomate of the [employer] of Sleep Medicine. Board certified In Neurology through the ABPN, Fellow of the [employer] of Neurology. Medical Director of [employer].

## 2020-12-03 ENCOUNTER — Telehealth: Payer: Self-pay

## 2020-12-03 NOTE — Telephone Encounter (Signed)
Unable to LVM for pt to call me back to schedule sleep study. MB full. Will try again later.

## 2020-12-05 ENCOUNTER — Telehealth: Payer: Self-pay

## 2020-12-05 NOTE — Telephone Encounter (Signed)
Unable to LVM for pt to call me back to schedule sleep study. VM box full

## 2020-12-11 ENCOUNTER — Other Ambulatory Visit: Payer: Self-pay

## 2020-12-11 ENCOUNTER — Ambulatory Visit (INDEPENDENT_AMBULATORY_CARE_PROVIDER_SITE_OTHER): Payer: BC Managed Care – PPO | Admitting: Neurology

## 2020-12-11 DIAGNOSIS — G4734 Idiopathic sleep related nonobstructive alveolar hypoventilation: Secondary | ICD-10-CM

## 2020-12-11 DIAGNOSIS — Z87898 Personal history of other specified conditions: Secondary | ICD-10-CM

## 2020-12-11 DIAGNOSIS — G4710 Hypersomnia, unspecified: Secondary | ICD-10-CM

## 2020-12-11 DIAGNOSIS — G43109 Migraine with aura, not intractable, without status migrainosus: Secondary | ICD-10-CM

## 2020-12-11 DIAGNOSIS — G4733 Obstructive sleep apnea (adult) (pediatric): Secondary | ICD-10-CM | POA: Diagnosis not present

## 2020-12-24 DIAGNOSIS — G4734 Idiopathic sleep related nonobstructive alveolar hypoventilation: Secondary | ICD-10-CM | POA: Insufficient documentation

## 2020-12-24 DIAGNOSIS — G4733 Obstructive sleep apnea (adult) (pediatric): Secondary | ICD-10-CM | POA: Insufficient documentation

## 2020-12-24 NOTE — Addendum Note (Signed)
Addended by: Melvyn Novas on: 12/24/2020 05:08 PM   Modules accepted: Orders

## 2020-12-24 NOTE — Procedures (Signed)
PATIENT'S NAME:  Tyler Colon, Tyler Colon DOB:      08/08/1971      MR#:    [id number]     DATE OF RECORDING: 12/11/2020  [name] REFERRING M.D.:  Osborn Coho, MD Study Performed:   Baseline Polysomnogram HISTORY:  Tyler Colon, a right -handed African American male with a medical history of Chronic migraine, Diabetes mellitus with complication (HCC), Diverticulitis, GERD (gastroesophageal reflux disease), Heart murmur, Hypertension, and Sleep apnea. Chief concern according to patient:  "I had a SS over 77yrs ago and was on CPAP machine. Then, with the machine's recall I stopped using the machine 4 months ago". He has noticed increase in daytime sleepiness and nocturia since stopping- Interested in [place]". Reports also a migraine headache history.   The patient endorsed the Epworth Sleepiness Scale at 11/24 points.   The patient's weight 207 pounds with a height of 65 (inches), resulting in a BMI of 34.5 kg/m2. The patient's neck circumference measured 18 inches.  CURRENT MEDICATIONS: Lipitor, Flexeril, Hydrodiuril, Cozaar, Glucophage, Toprol-XL, Revatio   PROCEDURE:  This is a multichannel digital polysomnogram utilizing the Somnostar 11.2 system.  Electrodes and sensors were applied and monitored per AASM Specifications.   EEG, EOG, Chin and Limb EMG, were sampled at 200 Hz.  ECG, Snore and Nasal Pressure, Thermal Airflow, Respiratory Effort, CPAP Flow and Pressure, Oximetry was sampled at 50 Hz. Digital video and audio were recorded.      BASELINE STUDY: Lights Out was at 20:48 and Lights On at 02:59.  Total recording time (TRT) was 372 minutes, with a total sleep time (TST) of 327 minutes.   The patient's sleep latency was 46.5 minutes.  REM latency was 81.5 minutes.  The sleep efficiency was 87.9 %.     SLEEP ARCHITECTURE: WASO (Wake after sleep onset) was 12.5 minutes.  There were 23 minutes in Stage N1, 222 minutes Stage N2, 25.5 minutes Stage N3 and 56.5 minutes in Stage REM.  The  percentage of Stage N1 was 7.%, Stage N2 was 67.9%, Stage N3 was 7.8% and Stage R (REM sleep) was 17.3%.   RESPIRATORY ANALYSIS:  There were a total of 68 respiratory events:  34 obstructive apneas, 1 central apnea and 2 mixed apneas with 31 hypopneas with a hypopnea index of 5.7 /hour. The patient also had few respiratory event related arousals (RERAs).      The total APNEA/HYPOPNEA INDEX (AHI) was 12.5/hour and the total RESPIRATORY DISTURBANCE INDEX was  14.5 /hour.  46 events occurred in REM sleep and 39 events in NREM.  The REM AHI was  48.8 /hour, versus a non-REM AHI of 4.9/h. The patient spent 125 minutes of total sleep time in the supine position and 202 minutes in non-supine. The supine AHI was 18.7/h versus a non-supine AHI of 8.6.  OXYGEN SATURATION & C02:  The Wake baseline 02 saturation was 94%, with the lowest being 77%. Time spent below 89% saturation equaled 31 minutes.  The arousals were noted as: 58 were spontaneous, 2 were associated with PLMs, 25 were associated with respiratory events.  The patient had a total of 6 Periodic Limb Movements.  The Periodic Limb Movement (PLM) Arousal index was 0.4/hour. Audio and video analysis did not show any abnormal or unusual movements, behaviors, phonations or vocalizations.   Loud Snoring was noted. EKG was in keeping with normal sinus rhythm (NSR).  IMPRESSION:  1. 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  2. Loud Snoring, UARS suspected. 3. Hypoxemia in sleep, 32 minutes, nadir 77%, in form of REM dependent ,recurrent  brief desaturations.    RECOMMENDATIONS: CPAP would be the recommended treatment Number 1, because this patient has strong REM sleep dependent apnea (of all 34 obstructive apneas, 17 were in REM sleep (50%). CPAP could alleviate the AHI.  Prolonged hypoxemia was noted - 32 minutes, nadir 77% which would need PAP therapy to improve. 2) If CPAP intolerant or  not interested in CPAP - Earnest Bailey is still an option as soon as a BMI of 32 or lower has been reached.  The patient can still benefit from [facility] device as it would reduce the overall Apnea Index by 50% and reduce the total AHI to approximately 6-7/h but we are unlikely to see improvement in hypoxia.  The patient is advised to avoid supine sleep and to reduce the BMI.     I certify that I have reviewed the entire raw data recording prior to the issuance of this report in accordance with the Standards of Accreditation of the American Academy of Sleep Medicine (AASM)     Melvyn Novas, MD Diplomat, American Board of Psychiatry and Neurology  Diplomat, American Board of Sleep Medicine Wellsite geologist, [place] [facility] [employer]

## 2020-12-24 NOTE — Progress Notes (Signed)
IMPRESSION:  1. Mild Obstructive Sleep Apnea (OSA)at an AHI of 12.5/h but strong REM dependence - REM AHI was 48.8/h.  2. Loud Snoring, UARS suspected. 3. Hypoxemia in sleep, 32 minutes, nadir 77%, in form of REM dependent ,recurrent  brief desaturations.    RECOMMENDATIONS: CPAP would be the recommended treatment Number 1, because this patient has strong REM sleep dependent apnea (of all 34 obstructive apneas, 17 were in REM sleep (50%). CPAP could alleviate the AHI.  Prolonged hypoxemia was noted - 32 minutes, nadir 77% which would need PAP therapy to improve. 2) If CPAP intolerant or not interested in CPAP - Earnest Bailey is still an option as soon as a BMI of 32 or lower has been reached.  The patient can still benefit from [facility] device as it would reduce the overall Apnea Index by 50% and reduce the total AHI to approximately 6-7/h but we are unlikely to see improvement in hypoxia.  The patient is advised to avoid supine sleep and to reduce the BMI.

## 2020-12-25 ENCOUNTER — Telehealth: Payer: Self-pay | Admitting: Neurology

## 2020-12-25 DIAGNOSIS — G43109 Migraine with aura, not intractable, without status migrainosus: Secondary | ICD-10-CM

## 2020-12-25 DIAGNOSIS — G4733 Obstructive sleep apnea (adult) (pediatric): Secondary | ICD-10-CM

## 2020-12-25 DIAGNOSIS — G4710 Hypersomnia, unspecified: Secondary | ICD-10-CM

## 2020-12-25 DIAGNOSIS — Z87898 Personal history of other specified conditions: Secondary | ICD-10-CM

## 2020-12-25 NOTE — Telephone Encounter (Signed)
-----   Message from Melvyn Novas, MD sent at 12/24/2020  5:08 PM EDT ----- IMPRESSION:  1. Mild Obstructive Sleep Apnea (OSA)at an AHI of 12.5/h but strong REM dependence - REM AHI was 48.8/h.  2. Loud Snoring, UARS suspected. 3. Hypoxemia in sleep, 32 minutes, nadir 77%, in form of REM dependent ,recurrent  brief desaturations.    RECOMMENDATIONS: CPAP would be the recommended treatment Number 1, because this patient has strong REM sleep dependent apnea (of all 34 obstructive apneas, 17 were in REM sleep (50%). CPAP could alleviate the AHI.  Prolonged hypoxemia was noted - 32 minutes, nadir 77% which would need PAP therapy to improve. 2) If CPAP intolerant or not interested in CPAP - Earnest Bailey is still an option as soon as a BMI of 32 or lower has been reached.  The patient can still benefit from [facility] device as it would reduce the overall Apnea Index by 50% and reduce the total AHI to approximately 6-7/h but we are unlikely to see improvement in hypoxia.  The patient is advised to avoid supine sleep and to reduce the BMI.

## 2020-12-25 NOTE — Telephone Encounter (Signed)
I called pt. I advised pt that Dr. Vickey Huger reviewed their sleep study results and found that pt has sleep apnea still present. Dr. Vickey Huger recommends that pt auto CPAP be the first line of treatment. Pt has tried this in past and didn't tolerate well and is hoping to pursue inspire. Advised the patient a copy of the report will be sent to Dr Annalee Genta and they can discuss whether can move forward with inspire. Pt would like to also have CPAP orders sent to a DME company to have the process go forward in case he is not a candidate for inspire. I reviewed PAP compliance expectations with the pt. Pt is agreeable to starting a CPAP. I advised pt that an order will be sent to a DME, [employer], and [employer] will call the pt within about one week after they file with the pt's insurance. [employer] will show the pt how to use the machine, fit for masks, and troubleshoot the CPAP if needed. A follow up appt will need to be made for insurance purposes with Dr. Vickey Huger or the NP 31-90 days from date he picks up the machine. Pt verbalized understanding to call and schedule this is he has to go with CPAP. A letter with all of this information in it will be sent to the pt as a reminder. Pt verbalized understanding of results. Pt had no questions at this time but was encouraged to call back if questions arise. I have sent the order to [employer] and have received confirmation that they have received the order.

## 2020-12-26 ENCOUNTER — Ambulatory Visit: Payer: BC Managed Care – PPO | Admitting: Dermatology

## 2021-03-04 ENCOUNTER — Ambulatory Visit: Payer: BC Managed Care – PPO | Admitting: Dermatology

## 2021-07-09 NOTE — Telephone Encounter (Signed)
Checked in with [employer] to see if the pt was ever set up.   "They were not set up. They never completed the virtual mask fitting or responded via scheduling dialer."

## 2022-12-30 DIAGNOSIS — Z20822 Contact with and (suspected) exposure to covid-19: Secondary | ICD-10-CM | POA: Diagnosis not present

## 2022-12-30 DIAGNOSIS — I509 Heart failure, unspecified: Secondary | ICD-10-CM | POA: Insufficient documentation

## 2022-12-30 DIAGNOSIS — Z79899 Other long term (current) drug therapy: Secondary | ICD-10-CM | POA: Diagnosis not present

## 2022-12-30 DIAGNOSIS — I110 Hypertensive heart disease with heart failure: Secondary | ICD-10-CM | POA: Diagnosis not present

## 2022-12-30 DIAGNOSIS — E876 Hypokalemia: Secondary | ICD-10-CM | POA: Insufficient documentation

## 2022-12-30 DIAGNOSIS — K5732 Diverticulitis of large intestine without perforation or abscess without bleeding: Secondary | ICD-10-CM | POA: Insufficient documentation

## 2022-12-30 DIAGNOSIS — E119 Type 2 diabetes mellitus without complications: Secondary | ICD-10-CM | POA: Diagnosis not present

## 2022-12-30 DIAGNOSIS — Z7984 Long term (current) use of oral hypoglycemic drugs: Secondary | ICD-10-CM | POA: Diagnosis not present

## 2022-12-30 DIAGNOSIS — D72829 Elevated white blood cell count, unspecified: Secondary | ICD-10-CM | POA: Insufficient documentation

## 2022-12-30 DIAGNOSIS — R1032 Left lower quadrant pain: Secondary | ICD-10-CM | POA: Diagnosis present

## 2022-12-31 ENCOUNTER — Encounter: Payer: Self-pay | Admitting: Emergency Medicine

## 2022-12-31 ENCOUNTER — Emergency Department
Admission: EM | Admit: 2022-12-31 | Discharge: 2022-12-31 | Disposition: A | Discharge status: Home / Self Care | Payer: 59 | Attending: Emergency Medicine | Admitting: Emergency Medicine

## 2022-12-31 ENCOUNTER — Emergency Department: Payer: 59

## 2022-12-31 DIAGNOSIS — R1032 Left lower quadrant pain: Secondary | ICD-10-CM

## 2022-12-31 DIAGNOSIS — K5792 Diverticulitis of intestine, part unspecified, without perforation or abscess without bleeding: Secondary | ICD-10-CM

## 2022-12-31 DIAGNOSIS — E876 Hypokalemia: Secondary | ICD-10-CM

## 2022-12-31 LAB — CBC
HCT: 40.6 % (ref 39.0–52.0)
Hemoglobin: 13.7 g/dL (ref 13.0–17.0)
MCH: 29.9 pg (ref 26.0–34.0)
MCHC: 33.7 g/dL (ref 30.0–36.0)
MCV: 88.6 fL (ref 80.0–100.0)
Platelets: 243 10*3/uL (ref 150–400)
RBC: 4.58 MIL/uL (ref 4.22–5.81)
RDW: 13.9 % (ref 11.5–15.5)
WBC: 12 10*3/uL — ABNORMAL HIGH (ref 4.0–10.5)
nRBC: 0 % (ref 0.0–0.2)

## 2022-12-31 LAB — URINALYSIS, ROUTINE W REFLEX MICROSCOPIC
Bacteria, UA: NONE SEEN
Bilirubin Urine: NEGATIVE
Glucose, UA: NEGATIVE mg/dL
Hgb urine dipstick: NEGATIVE
Ketones, ur: NEGATIVE mg/dL
Leukocytes,Ua: NEGATIVE
Nitrite: NEGATIVE
Protein, ur: 30 mg/dL — AB
Specific Gravity, Urine: 1.021 (ref 1.005–1.030)
Squamous Epithelial / HPF: NONE SEEN /HPF (ref 0–5)
pH: 6 (ref 5.0–8.0)

## 2022-12-31 LAB — RESP PANEL BY RT-PCR (RSV, FLU A&B, COVID)  RVPGX2
Influenza A by PCR: NEGATIVE
Influenza B by PCR: NEGATIVE
Resp Syncytial Virus by PCR: NEGATIVE
SARS Coronavirus 2 by RT PCR: NEGATIVE

## 2022-12-31 LAB — COMPREHENSIVE METABOLIC PANEL
ALT: 32 U/L (ref 0–44)
AST: 25 U/L (ref 15–41)
Albumin: 4.2 g/dL (ref 3.5–5.0)
Alkaline Phosphatase: 54 U/L (ref 38–126)
Anion gap: 9 (ref 5–15)
BUN: 12 mg/dL (ref 6–20)
CO2: 25 mmol/L (ref 22–32)
Calcium: 8.8 mg/dL — ABNORMAL LOW (ref 8.9–10.3)
Chloride: 102 mmol/L (ref 98–111)
Creatinine, Ser: 0.84 mg/dL (ref 0.61–1.24)
GFR, Estimated: >60 mL/min (ref >60)
Glucose, Bld: 128 mg/dL — ABNORMAL HIGH (ref 70–99)
Potassium: 3.1 mmol/L — ABNORMAL LOW (ref 3.5–5.1)
Sodium: 136 mmol/L (ref 135–145)
Total Bilirubin: 1.9 mg/dL — ABNORMAL HIGH (ref 0.3–1.2)
Total Protein: 7.7 g/dL (ref 6.5–8.1)

## 2022-12-31 LAB — LIPASE, BLOOD: Lipase: 42 U/L (ref 11–51)

## 2022-12-31 MED ORDER — AMOXICILLIN-POT CLAVULANATE 875-125 MG PO TABS: 1.0000 | ORAL_TABLET | Freq: Two times a day (BID) | ORAL | 0 refills | Status: DC

## 2022-12-31 MED ORDER — PIPERACILLIN-TAZOBACTAM 3.375 G IVPB 30 MIN
3.3750 g | Freq: Once | INTRAVENOUS | Status: AC
  Administered 2022-12-31: 3.375 g via INTRAVENOUS
  Filled 2022-12-31: qty 50

## 2022-12-31 MED ORDER — SODIUM CHLORIDE 0.9 % IV BOLUS
1000.0000 mL | Freq: Once | INTRAVENOUS | Status: AC
  Administered 2022-12-31: 1000 mL via INTRAVENOUS

## 2022-12-31 MED ORDER — ONDANSETRON 4 MG PO TBDP: 4.0000 mg | ORAL_TABLET | Freq: Three times a day (TID) | ORAL | 0 refills | Status: DC | PRN

## 2022-12-31 MED ORDER — IOHEXOL 300 MG/ML  SOLN
100.0000 mL | Freq: Once | INTRAMUSCULAR | Status: AC | PRN
  Administered 2022-12-31: 100 mL via INTRAVENOUS

## 2022-12-31 MED ORDER — POTASSIUM CHLORIDE CRYS ER 20 MEQ PO TBCR
40.0000 meq | EXTENDED_RELEASE_TABLET | Freq: Once | ORAL | Status: AC
  Administered 2022-12-31: 40 meq via ORAL
  Filled 2022-12-31: qty 2

## 2022-12-31 MED ORDER — MORPHINE SULFATE (PF) 4 MG/ML IV SOLN
4.0000 mg | Freq: Once | INTRAVENOUS | Status: AC
  Administered 2022-12-31: 4 mg via INTRAVENOUS
  Filled 2022-12-31: qty 1

## 2022-12-31 MED ORDER — OXYCODONE-ACETAMINOPHEN 5-325 MG PO TABS
1.0000 | ORAL_TABLET | Freq: Once | ORAL | Status: AC
  Administered 2022-12-31: 1 via ORAL
  Filled 2022-12-31: qty 1

## 2022-12-31 MED ORDER — ONDANSETRON HCL 4 MG/2ML IJ SOLN
4.0000 mg | Freq: Once | INTRAMUSCULAR | Status: AC
  Administered 2022-12-31: 4 mg via INTRAVENOUS
  Filled 2022-12-31: qty 2

## 2022-12-31 MED ORDER — OXYCODONE-ACETAMINOPHEN 5-325 MG PO TABS: 1.0000 | ORAL_TABLET | ORAL | 0 refills | Status: DC | PRN

## 2022-12-31 NOTE — ED Provider Notes (Signed)
[facility] Provider Note    Event Date/Time   First MD Initiated Contact with Patient 12/31/22 [phone]     (approximate)   History   Abdominal Pain   HPI  Tyler Colon is a 52 y.o. male who presents to the ED from home with a 1 day history of left lower abdominal pain.  Denies associated fever/chills, chest pain, shortness of breath, nausea, vomiting, dysuria or diarrhea.  History of diverticulitis and states this feels similarly.     Past Medical History   Past Medical History:  Diagnosis Date   Chronic migraine    Diabetes mellitus without complication (HCC)    Diverticulitis    GERD (gastroesophageal reflux disease)    Heart murmur    Hypertension    Sleep apnea      Active Problem List   Patient Active Problem List   Diagnosis Date Noted   Mild obstructive sleep apnea-hypopnea syndrome 12/24/2020   Chronic intermittent hypoxia with obstructive sleep apnea 12/24/2020   Intolerance of continuous positive airway pressure (CPAP) ventilation 11/27/2020   History of nocturia 11/27/2020   OSA (obstructive sleep apnea) 11/27/2020   Hypersomnia with sleep apnea 11/27/2020   Personal history of colonic polyps    Erectile dysfunction 08/22/2018   Obesity (BMI 30-39.9) 08/22/2018   Sleep apnea 08/22/2018   Diabetes mellitus, type 2 ([place]) 06/12/2018   IGT (impaired glucose tolerance) 03/12/2017   Vitamin D deficiency 03/12/2017   Essential hypertension 01/20/2017   Cerumen impaction 11/18/2015   Nasal sinus congestion 08/26/2015   Sore throat 06/06/2015   Anxiety 05/30/2015   CHF (congestive heart failure) ([place]) 05/30/2015   Chest pain 05/29/2015   Diarrhea in adult patient 05/02/2015   Elevated PSA 04/18/2015   Elevated hemoglobin A1c measurement 04/18/2015   Right ear impacted cerumen 04/18/2015   Abdominal pain 04/16/2015   Abnormal LFTs 04/16/2015   Back ache 04/16/2015   Encounter for general adult medical examination without  abnormal findings 04/16/2015   Dyslipidemia 04/16/2015   Encounter for screening for malignant neoplasm of prostate 04/16/2015   Migraine with aura and without status migrainosus, not intractable 04/16/2015   Cannot sleep 04/16/2015   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 Lucilla Lame, [facility]   COLONOSCOPY WITH PROPOFOL N/A 01/30/2020   Procedure: COLONOSCOPY WITH PROPOFOL;  Surgeon: Lucilla Lame, MD;  Location: [facility] ENDOSCOPY;  Service: Endoscopy;  Laterality: N/A;   VASECTOMY  2002     Home Medications   Prior to Admission medications   Medication Sig Start Date End Date Taking? Authorizing Provider  amoxicillin-clavulanate (AUGMENTIN) 875-125 MG tablet Take 1 tablet by mouth 2 (two) times daily. 12/31/22  Yes Paulette Blanch, MD  ondansetron (ZOFRAN-ODT) 4 MG disintegrating tablet Take 1 tablet (4 mg total) by mouth every 8 (eight) hours as needed for nausea or vomiting. 12/31/22  Yes Paulette Blanch, MD  oxyCODONE-acetaminophen (PERCOCET/ROXICET) 5-325 MG tablet Take 1 tablet by mouth every 4 (four) hours as needed for severe pain. 12/31/22  Yes Paulette Blanch, MD  atorvastatin (LIPITOR) 20 MG tablet Take 20 mg by mouth  daily. 12/29/19   [provider]  cyclobenzaprine (FLEXERIL) 10 MG tablet Take 1 tablet (10 mg total) by mouth at bedtime. 06/19/20   Fisher, Linden Dolin, PA-C  glucose blood (PRECISION QID TEST) test strip Use 1 each (1 strip total) once daily for 90 days 11/15/18   [provider]  hydrochlorothiazide (HYDRODIURIL) 25 MG tablet Take 1 tablet (25 mg total) by mouth daily. 06/19/20   Fisher, Linden Dolin, PA-C   Lancets Misc. Patricia Pesa 2 NORMAL) MISC Use 1 each once daily for 90 days 11/15/18   [provider]  losartan (COZAAR) 50 MG tablet Take 50 mg by mouth daily. 12/29/19   [provider]  metFORMIN (GLUCOPHAGE-XR) 500 MG 24 hr tablet Take 500 mg by mouth daily.  07/13/18   [provider]  metoprolol succinate (TOPROL-XL) 50 MG 24 hr tablet Take 50 mg by mouth daily. 11/30/19   [provider]  sildenafil (REVATIO) 20 MG tablet TAKE 1 2 TABLETS (20 40 MG TOTAL) BY MOUTH ONCE DAILY AS NEEDED (PRIOR TO INTERCOURSE) 11/28/19   [provider]     Allergies  Lisinopril   Family History   Family History  Problem Relation Age of Onset   Hypertension Mother    Diabetes Mother    Cancer Father        Lung cancer   Diabetes Father    Heart failure Father    Hypertension Maternal Aunt    Cancer Maternal Aunt        Breast Cancer   Hypertension Maternal Grandmother    Diabetes Maternal Grandmother    Cancer Maternal Grandfather        Lung Cancer     Physical Exam  Triage Vital Signs: ED Triage Vitals  Enc Vitals Group     BP 12/31/22 0008 (!) 155/78     Pulse Rate 12/31/22 0008 98     Resp 12/31/22 0008 18     Temp 12/31/22 0008 99.6 F (37.6 C)     Temp Source 12/31/22 0008 Oral     SpO2 12/31/22 0008 100 %     Weight 12/30/22 2358 220 lb (99.8 kg)     Height 12/30/22 2358 5\' 5"  (1.651 m)     Head Circumference --      Peak Flow --      Pain Score 12/30/22 2358 10     Pain Loc --      Pain Edu? --      Excl. in [place]? --     Updated Vital Signs: BP (!) 147/89 (BP Location: Right Arm)   Pulse 91   Temp 98.9 F (37.2 C) (Oral)   Resp 18   Ht 5\' 5"  (1.651 m)   Wt 99.8 kg   SpO2 94%   BMI 36.61 kg/m    General: Awake, no distress.  CV:  RRR.  Good peripheral perfusion.  Resp:  Normal effort.  CTAB. Abd:  Mildly tender to palpation left lower quadrant without rebound or guarding.  No distention.  No truncal  vesicles. Other:  GU: Normal exam   ED Results / Procedures / Treatments  Labs (all labs ordered are listed, but only abnormal results are displayed) Labs Reviewed  COMPREHENSIVE METABOLIC PANEL - Abnormal; Notable for the following components:      Result Value   Potassium 3.1 (*)    Glucose, Bld 128 (*)    Calcium 8.8 (*)    Total Bilirubin 1.9 (*)  All other components within normal limits  CBC - Abnormal; Notable for the following components:   WBC 12.0 (*)    All other components within normal limits  URINALYSIS, ROUTINE W REFLEX MICROSCOPIC - Abnormal; Notable for the following components:   Color, Urine YELLOW (*)    APPearance CLEAR (*)    Protein, ur 30 (*)    All other components within normal limits  RESP PANEL BY RT-PCR (RSV, FLU A&B, COVID)  RVPGX2  LIPASE, BLOOD     EKG  None   RADIOLOGY I have independently visualized and interpreted patient's CT scan as well as noted the radiology interpretation:  CT abdomen/pelvis: Uncomplicated acute diverticulitis  Official radiology report(s): CT Abdomen Pelvis W Contrast  Result Date: 12/31/2022 CLINICAL DATA:  Left lower quadrant abdominal pain, nausea, vomiting EXAM: CT ABDOMEN AND PELVIS WITH CONTRAST TECHNIQUE: Multidetector CT imaging of the abdomen and pelvis was performed using the standard protocol following bolus administration of intravenous contrast. RADIATION DOSE REDUCTION: This exam was performed according to the departmental dose-optimization program which includes automated exposure control, adjustment of the mA and/or kV according to patient size and/or use of iterative reconstruction technique. CONTRAST:  193mL OMNIPAQUE IOHEXOL 300 MG/ML  SOLN COMPARISON:  11/29/2018 FINDINGS: Lower chest: No acute abnormality.  Small hiatal hernia. Hepatobiliary: Mild hepatic steatosis. No enhancing intrahepatic mass. No intra or extrahepatic biliary ductal dilation. Gallbladder unremarkable. Pancreas: Mild  unremarkable Spleen: Unremarkable Adrenals/Urinary Tract: Adrenal glands are unremarkable. Kidneys are normal, without renal calculi, focal lesion, or hydronephrosis. Bladder is unremarkable. Stomach/Bowel: Moderate descending and sigmoid colonic diverticulosis is present. There is superimposed circumferential bowel wall thickening and extensive pericolonic inflammatory stranding in keeping with changes of superimposed sigmoid diverticulitis. No loculated pericolonic fluid collections or extraluminal gas is identified. The stomach, small bowel, and large bowel are otherwise unremarkable. No evidence of obstruction. No free intraperitoneal gas or fluid. Appendix normal. Vascular/Lymphatic: Mild aortoiliac atherosclerotic calcification. No aortic aneurysm. No pathologic adenopathy within the abdomen and pelvis. Reproductive: Moderate prostatic hypertrophy. Other: No abdominal wall hernia. Musculoskeletal: No acute or significant osseous findings. IMPRESSION: 1. Acute sigmoid diverticulitis. No evidence of obstruction or perforation. Background moderate sigmoid diverticulosis. 2. Mild hepatic steatosis. 3. Moderate prostatic hypertrophy. Aortic Atherosclerosis (ICD10-I70.0). Electronically Signed   By: Fidela Salisbury M.D.   On: 12/31/2022 02:19     PROCEDURES:  Critical Care performed: No  Procedures   MEDICATIONS ORDERED IN ED: Medications  iohexol (OMNIPAQUE) 300 MG/ML solution 100 mL (100 mLs Intravenous Contrast Given 12/31/22 0151)  sodium chloride 0.9 % bolus 1,000 mL (0 mLs Intravenous Stopped 12/31/22 0432)  ondansetron (ZOFRAN) injection 4 mg (4 mg Intravenous Given 12/31/22 0257)  morphine (PF) 4 MG/ML injection 4 mg (4 mg Intravenous Given 12/31/22 0257)  piperacillin-tazobactam (ZOSYN) IVPB 3.375 g (0 g Intravenous Stopped 12/31/22 0410)  potassium chloride SA (KLOR-CON M) CR tablet 40 mEq (40 mEq Oral Given 12/31/22 0257)  oxyCODONE-acetaminophen (PERCOCET/ROXICET) 5-325 MG per tablet 1 tablet  (1 tablet Oral Given 12/31/22 0516)     IMPRESSION / MDM / ASSESSMENT AND PLAN / ED COURSE  I reviewed the triage vital signs and the nursing notes.                             52 year old male presenting with left lower quadrant abdominal pain, history of diverticulitis. Differential diagnosis includes, but is not limited to, acute appendicitis, renal colic, testicular torsion, urinary tract infection/pyelonephritis, prostatitis,  epididymitis, diverticulitis, small bowel obstruction or ileus, colitis, abdominal aortic aneurysm, gastroenteritis, hernia, etc. I personally reviewed patient's records and note a PCP scheduled yearly office visit for 11/24/2022.  Patient's presentation is most consistent with acute presentation with potential threat to life or bodily function.  Laboratory results demonstrate mild leukocytosis WBC 12, mild hypokalemia potassium is 3.1, negative UA and respiratory panel.  CT scan demonstrates uncomplicated acute sigmoid diverticulitis.  Will administer IV fluid hydration, IV Zosyn, Morphine and Zofran.  Clinical Course as of 12/31/22 0704  Thu Dec 31, 2022  0703 Chart review: Patient felt better after IV medications and was discharged home in good and stable condition.  Strict return precautions were given.  Patient verbalized understanding and agreed with plan of care. [JS]    Clinical Course User Index [JS] Paulette Blanch, MD     FINAL CLINICAL IMPRESSION(S) / ED DIAGNOSES   Final diagnoses:  Left lower quadrant abdominal pain  Diverticulitis  Hypokalemia     Rx / DC Orders   ED Discharge Orders          Ordered    amoxicillin-clavulanate (AUGMENTIN) 875-125 MG tablet  2 times daily        12/31/22 0245    oxyCODONE-acetaminophen (PERCOCET/ROXICET) 5-325 MG tablet  Every 4 hours PRN        12/31/22 0245    ondansetron (ZOFRAN-ODT) 4 MG disintegrating tablet  Every 8 hours PRN        12/31/22 0245             Note:  This document was  prepared using Dragon voice recognition software and may include unintentional dictation errors.   Paulette Blanch, MD 12/31/22 [phone]

## 2022-12-31 NOTE — ED Triage Notes (Signed)
Pt presents ambulatory to triage via POV with complaints of lower abdominal cramping with associated N/V that started 2-3 days ago. Hx of diverticulitis. No meds taken PTA. A&Ox4 at this time. Denies CP or SOB.

## 2022-12-31 NOTE — Discharge Instructions (Signed)
1.  Take and finish antibiotic as prescribed. 2.  You may take pain and nausea medicines as needed. 3.  Return to the ER for worsening symptoms, persistent vomiting, difficulty breathing or other concerns.

## 2023-01-02 ENCOUNTER — Other Ambulatory Visit: Payer: Self-pay

## 2023-01-02 ENCOUNTER — Emergency Department
Admission: EM | Admit: 2023-01-02 | Discharge: 2023-01-02 | Disposition: A | Discharge status: Home / Self Care | Payer: 59 | Attending: Emergency Medicine | Admitting: Emergency Medicine

## 2023-01-02 DIAGNOSIS — R109 Unspecified abdominal pain: Secondary | ICD-10-CM | POA: Diagnosis present

## 2023-01-02 DIAGNOSIS — K5732 Diverticulitis of large intestine without perforation or abscess without bleeding: Secondary | ICD-10-CM | POA: Diagnosis not present

## 2023-01-02 DIAGNOSIS — K5792 Diverticulitis of intestine, part unspecified, without perforation or abscess without bleeding: Secondary | ICD-10-CM

## 2023-01-02 NOTE — ED Triage Notes (Signed)
Abdominal pain for the past few days. Seen here and given antibiotics and pain meds. Pt just wanting a doctors note for this visit for work.

## 2023-01-02 NOTE — ED Provider Notes (Signed)
   [facility] Provider Note    Event Date/Time   First MD Initiated Contact with Patient 01/02/23 1312     (approximate)   History   Abdominal Pain   HPI  Tyler Colon is a 52 y.o. male who was diagnosed with diverticulitis 2 days ago, started taking his antibiotics yesterday.  Wanted to go to work tomorrow but is still requiring oxycodone for pain control and his work told him he could not come into work on oxycodone.  He reports symptoms are improving but he continues to have discomfort and requires pain medication.  He apparently needs a work note     Physical Exam   Triage Vital Signs: ED Triage Vitals [01/02/23 1301]  Enc Vitals Group     BP (!) 163/96     Pulse Rate 71     Resp 18     Temp 98.1 F (36.7 C)     Temp Source Oral     SpO2 97 %     Weight      Height      Head Circumference      Peak Flow      Pain Score 6     Pain Loc      Pain Edu?      Excl. in [place]?     Most recent vital signs: Vitals:   01/02/23 1301  BP: (!) 163/96  Pulse: 71  Resp: 18  Temp: 98.1 F (36.7 C)  SpO2: 97%     General: Awake, no distress.  CV:  Good peripheral perfusion.  Resp:  Normal effort.  Abd:  No distention.  Other:     ED Results / Procedures / Treatments   Labs (all labs ordered are listed, but only abnormal results are displayed) Labs Reviewed - No data to display   EKG     RADIOLOGY     PROCEDURES:  Critical Care performed:   Procedures   MEDICATIONS ORDERED IN ED: Medications - No data to display   IMPRESSION / MDM / [place] / ED COURSE  I reviewed the triage vital signs and the nursing notes. Patient's presentation is most consistent with acute, uncomplicated illness.  Patient requires a work note, provided.  No indication for further workup at this time, return precautions discussed.        FINAL CLINICAL IMPRESSION(S) / ED DIAGNOSES   Final diagnoses:  Diverticulitis      Rx / DC Orders   ED Discharge Orders     None        Note:  This document was prepared using Dragon voice recognition software and may include unintentional dictation errors.   Lavonia Drafts, MD 01/02/23 1537

## 2023-01-02 NOTE — ED Triage Notes (Signed)
First Nurse Note;  Pt via POV from home. Pt states that his employer states that he has to not constantly take the oxycodone prescribed, pt states that the pain is still constant and cannot stop taking the oxycodone. States he needs a work note for tomorrow because pt states that is his scheduled shift. Pt is A&Ox4 and NAD.

## 2023-04-19 ENCOUNTER — Emergency Department: Payer: 59

## 2023-04-19 ENCOUNTER — Other Ambulatory Visit: Payer: Self-pay

## 2023-04-19 ENCOUNTER — Emergency Department
Admission: EM | Admit: 2023-04-19 | Discharge: 2023-04-19 | Disposition: A | Discharge status: Home / Self Care | Payer: 59 | Attending: Emergency Medicine | Admitting: Emergency Medicine

## 2023-04-19 DIAGNOSIS — I10 Essential (primary) hypertension: Secondary | ICD-10-CM | POA: Diagnosis not present

## 2023-04-19 DIAGNOSIS — I669 Occlusion and stenosis of unspecified cerebral artery: Secondary | ICD-10-CM | POA: Diagnosis not present

## 2023-04-19 DIAGNOSIS — R519 Headache, unspecified: Secondary | ICD-10-CM | POA: Diagnosis not present

## 2023-04-19 DIAGNOSIS — R079 Chest pain, unspecified: Secondary | ICD-10-CM | POA: Diagnosis not present

## 2023-04-19 DIAGNOSIS — I679 Cerebrovascular disease, unspecified: Secondary | ICD-10-CM

## 2023-04-19 DIAGNOSIS — E119 Type 2 diabetes mellitus without complications: Secondary | ICD-10-CM | POA: Insufficient documentation

## 2023-04-19 DIAGNOSIS — R42 Dizziness and giddiness: Secondary | ICD-10-CM | POA: Diagnosis present

## 2023-04-19 LAB — CBC
HCT: 46.9 % (ref 39.0–52.0)
Hemoglobin: 15.7 g/dL (ref 13.0–17.0)
MCH: 29.2 pg (ref 26.0–34.0)
MCHC: 33.5 g/dL (ref 30.0–36.0)
MCV: 87.3 fL (ref 80.0–100.0)
Platelets: 272 10*3/uL (ref 150–400)
RBC: 5.37 MIL/uL (ref 4.22–5.81)
RDW: 13.1 % (ref 11.5–15.5)
WBC: 7.5 10*3/uL (ref 4.0–10.5)
nRBC: 0 % (ref 0.0–0.2)

## 2023-04-19 LAB — BASIC METABOLIC PANEL
Anion gap: 9 (ref 5–15)
BUN: 12 mg/dL (ref 6–20)
CO2: 28 mmol/L (ref 22–32)
Calcium: 9 mg/dL (ref 8.9–10.3)
Chloride: 98 mmol/L (ref 98–111)
Creatinine, Ser: 0.98 mg/dL (ref 0.61–1.24)
GFR, Estimated: >60 mL/min (ref >60)
Glucose, Bld: 156 mg/dL — ABNORMAL HIGH (ref 70–99)
Potassium: 3.6 mmol/L (ref 3.5–5.1)
Sodium: 135 mmol/L (ref 135–145)

## 2023-04-19 LAB — TROPONIN I (HIGH SENSITIVITY)
Troponin I (High Sensitivity): 13 ng/L (ref <18)
Troponin I (High Sensitivity): 13 ng/L (ref <18)

## 2023-04-19 MED ORDER — ACETAMINOPHEN 500 MG PO TABS
1000.0000 mg | ORAL_TABLET | Freq: Once | ORAL | Status: AC
  Administered 2023-04-19: 1000 mg via ORAL
  Filled 2023-04-19: qty 2

## 2023-04-19 MED ORDER — IOHEXOL 350 MG/ML SOLN
75.0000 mL | Freq: Once | INTRAVENOUS | Status: AC | PRN
  Administered 2023-04-19: 75 mL via INTRAVENOUS

## 2023-04-19 MED ORDER — ASPIRIN 81 MG PO TBEC: 81.0000 mg | DELAYED_RELEASE_TABLET | Freq: Every day | ORAL | 11 refills | Status: AC

## 2023-04-19 NOTE — ED Triage Notes (Addendum)
Pt comes with c/o dizziness, blurry vision and HTN  that started this weekend. Pt states hx of HTN and does take meds for it. Pt states he thinks it could be stress related after finding out that his wife needs heart surgery.  Pt denies any sob or cp. Pt does state headache 6-10 pain.

## 2023-04-19 NOTE — Discharge Instructions (Addendum)
Your testing in the emergency department showed no emergency conditions like bleeding or stroke.  There are some blood vessels in your brain that are narrowed and your MRI showed signs of a stroke from a long time ago.  It is important that you control your blood pressure by taking all medications as prescribed by your heart doctor and follow-up for further blood pressure control.  I spoke with the neurologist who recommended that you start on a baby aspirin, I prescribed this medication for you to take daily.  For your headache you may take 650 mg of Tylenol every 6 hours as needed.  Thank you for choosing [place] for your health care today!  Please see your primary doctor this week for a follow up appointment.   If you have any new, worsening, or unexpected symptoms call your doctor right away or come back to the emergency department for reevaluation.  It was my pleasure to care for you today.   Daneil Dan Modesto Charon, MD

## 2023-04-19 NOTE — ED Notes (Signed)
Pt back to bed after voiding. Pt hooked back up to bedside monitor. Pt requested crackers and a drink. Dr Modesto Charon approved request. Crackers and sprite were provided to pt. Pt had call bell within reach. No other needs at this time.

## 2023-04-19 NOTE — ED Provider Notes (Signed)
[facility] Provider Note    Event Date/Time   First MD Initiated Contact with Patient 04/19/23 [phone]     (approximate)   History   Dizziness   HPI  Tyler Colon is a 52 y.o. male   Past medical history of migraines, hypertension and diabetes who presents to the emergency department with headache and dizziness for the past 1 week.  He has a headache at the base of his neck to his occiput constant, started mild but worsening throughout the week, atraumatic.  It started when he found out that his wife needed a heart procedure last week.  He has been stressed worrying about this.  He also has had some associated chest discomfort/tightness.  He denies any shortness of breath, cough or fever.  The symptoms are nonexertional.  They are not worse with movement.  They are constant.  He has some associated blurry vision occasionally.  He has otherwise been very functional performing all activities of daily living despite the symptoms.  He came to the emergency department today because now it is approximately 1 week and the symptoms have not gotten better.   External Medical Documents Reviewed: Cardiology note from Dr. Darrold Junker from last month, poorly controlled hypertension, on multiple antihypertensives      Physical Exam   Triage Vital Signs: ED Triage Vitals  Enc Vitals Group     BP 04/19/23 0916 (!) 188/103     Pulse Rate 04/19/23 0916 65     Resp 04/19/23 0916 18     Temp 04/19/23 0916 98 F (36.7 C)     Temp src --      SpO2 04/19/23 0916 97 %     Weight --      Height --      Head Circumference --      Peak Flow --      Pain Score 04/19/23 0917 6     Pain Loc --      Pain Edu? --      Excl. in [place]? --     Most recent vital signs: Vitals:   04/19/23 0916 04/19/23 1000  BP: (!) 188/103 (!) 160/108  Pulse: 65 71  Resp: 18 18  Temp: 98 F (36.7 C)   SpO2: 97% 99%    General: Awake, no distress.  CV:  Good peripheral perfusion.   Resp:  Normal effort.  Abd:  No distention.  Other:  Hypertensive, otherwise vital signs within normal limits comfortable nontoxic-appearing patient.  Neck supple with full range of motion.  No trauma noted on his head or neck.  No focal neurologic deficits including dysarthria facial asymmetry motor or sensory, gait, finger-nose.   ED Results / Procedures / Treatments   Labs (all labs ordered are listed, but only abnormal results are displayed) Labs Reviewed  BASIC METABOLIC PANEL - Abnormal; Notable for the following components:      Result Value   Glucose, Bld 156 (*)    All other components within normal limits  CBC  CBG MONITORING, ED  TROPONIN I (HIGH SENSITIVITY)  TROPONIN I (HIGH SENSITIVITY)     I ordered and reviewed the above labs they are notable for blood glucose is 156, electrolytes within normal limits creatinine is normal, cell count is normal.  EKG  ED ECG REPORT I, Pilar Jarvis, the attending physician, personally viewed and interpreted this ECG.   Date: 04/19/2023  EKG Time: 0918  Rate: 71  Rhythm: nsr  Axis: lad  Intervals:none  ST&T Change: no stemi    RADIOLOGY I independently reviewed and interpreted Cthead and see no bleeding or midline shift   PROCEDURES:  Critical Care performed: No  Procedures   MEDICATIONS ORDERED IN ED: Medications  acetaminophen (TYLENOL) tablet 1,000 mg (1,000 mg Oral Given 04/19/23 1032)  iohexol (OMNIPAQUE) 350 MG/ML injection 75 mL (75 mLs Intravenous Contrast Given 04/19/23 1049)    IMPRESSION / MDM / ASSESSMENT AND PLAN / ED COURSE  I reviewed the triage vital signs and the nursing notes.                                Patient's presentation is most consistent with acute presentation with potential threat to life or bodily function.  Differential diagnosis includes, but is not limited to, ICH, CVA, vertebral or carotid dissection, migraine headache, stress-induced symptoms, hypertension, ACS   The  patient is on the cardiac monitor to evaluate for evidence of arrhythmia and/or significant heart rate changes.  MDM:   This is a patient with 1 week of headache with a history of migraines but this is atypical for his migraine headache, started after stress involved with his wife medical diagnosis.  Consider ICH, CVA, dissection so obtain a CT angiogram of the head and neck as well as an MRI of the brain to rule out these causes.  I have low suspicion overall given his well appearance, mild onset and gradual worsening, no focal deficits, atraumatic nature of pain.  However given his hypertension and persistence of symptoms will obtain the studies today in the emergency department, he has a history of poorly controlled blood pressure, follows with cardiology, advised to continue taking his antihypertensives and follow-up for further blood pressure control.  Check basic labs, EKG, troponins given his associated shortness of breath to rule out ACS.   Consultant--I spoke with Dr. Amada Jupiter of neurology regarding the intracranial stenoses and old infarct on MRI, chronic findings and recommendation for medical optimization of risk factors including blood pressure control and starting on a baby aspirin daily.  He will follow-up with his heart doctor and his PMD.      FINAL CLINICAL IMPRESSION(S) / ED DIAGNOSES   Final diagnoses:  Occipital headache  Nonspecific chest pain  Intracranial vascular stenosis     Rx / DC Orders   ED Discharge Orders          Ordered    aspirin EC 81 MG tablet  Daily        04/19/23 1232             Note:  This document was prepared using Dragon voice recognition software and may include unintentional dictation errors.    Pilar Jarvis, MD 04/19/23 [phone]

## 2023-12-22 ENCOUNTER — Ambulatory Visit (INDEPENDENT_AMBULATORY_CARE_PROVIDER_SITE_OTHER): Admitting: Urology

## 2023-12-22 VITALS — BP 175/124 | HR 76 | Ht 66.0 in | Wt 206.2 lb

## 2023-12-22 DIAGNOSIS — R350 Frequency of micturition: Secondary | ICD-10-CM

## 2023-12-22 DIAGNOSIS — R399 Unspecified symptoms and signs involving the genitourinary system: Secondary | ICD-10-CM

## 2023-12-22 LAB — BLADDER SCAN AMB NON-IMAGING

## 2023-12-22 NOTE — Progress Notes (Signed)
   12/22/23 9:21 AM   Tyler Colon 02-12-1971 [id number]  CC: Urinary symptoms, dysuria, urgency/frequency  HPI: 53 year old male with sleep apnea, diabetes(hemoglobin A1c 7.6) who presents with worsening urinary symptoms since December 2024.  Primary complaints are nocturia 3 times per night, urgency, urge incontinence, burning with urination and reported arm tingling.  He was trialed on both Flomax and finasteride by PCP with no significant improvement.  Most recently was prescribed a 2-week course of Cipro for possible prostatitis.  Urinalysis was benign x 3 in December, February, and March aside from glucosuria.  He drinks a fair amount of soda and tea during the day.  PVR today is normal at 80ml.  IPSS score today is 25, with quality-of-life unhappy.  PSA March 2023 normal at 2.69   PMH: Past Medical History:  Diagnosis Date   Chronic migraine    Diabetes mellitus without complication (HCC)    Diverticulitis    GERD (gastroesophageal reflux disease)    Heart murmur    Hypertension    Sleep apnea     Surgical History: Past Surgical History:  Procedure Laterality Date   APPENDECTOMY     COLONOSCOPY  12/13/2014   Repeat colonoscopy 12/2019 - Dr. Midge Minium, [facility]   COLONOSCOPY WITH PROPOFOL N/A 01/30/2020   Procedure: COLONOSCOPY WITH PROPOFOL;  Surgeon: Midge Minium, MD;  Location: [facility] ENDOSCOPY;  Service: Endoscopy;  Laterality: N/A;   VASECTOMY  2002     Family History: Family History  Problem Relation Age of Onset   Hypertension Mother    Diabetes Mother    Cancer Father        Lung cancer   Diabetes Father    Heart failure Father    Hypertension Maternal Aunt    Cancer Maternal Aunt        Breast Cancer   Hypertension Maternal Grandmother    Diabetes Maternal Grandmother    Cancer Maternal Grandfather        Lung Cancer    Social History:  reports that he has never smoked. He has never used smokeless tobacco. He reports current alcohol use. He  reports that he does not use drugs.  Physical Exam: BP (!) 175/124 (BP Location: Left Arm, Patient Position: Sitting, Cuff Size: Large)   Pulse 76   Ht 5\' 6"  (1.676 m)   Wt 206 lb 3.2 oz (93.5 kg)   SpO2 98%   BMI 33.28 kg/m    Constitutional:  Alert and oriented, No acute distress. Cardiovascular: No clubbing, cyanosis, or edema. Respiratory: Normal respiratory effort, no increased work of breathing. GI: Abdomen is soft, nontender, nondistended, no abdominal masses   Laboratory Data: Reviewed, see HPI  Pertinent Imaging: I have personally viewed and interpreted the CT scan from March 2024 showing a prostate measuring 44g, decompressed bladder, no hydronephrosis.  Assessment & Plan:   53 year old male with 3 to 4 months of bothersome urinary symptoms with urgency, urge incontinence, frequency, nocturia, dysuria of unclear etiology.  Urinalysis has been benign x 3 aside from glucosuria, emptying appropriately today.  Currently on antibiotics from PCP for possible prostatitis with only minimal improvement.  We reviewed possible etiologies including infection, inflammation, stricture, overactive bladder, BPH, malignancy.  Finish Cipro prescribed by PCP, continue Flomax and finasteride Avoid bladder irritants RTC for cystoscopy for further evaluation   Legrand Rams, MD 12/22/2023  [facility] [address] [place], [place] [address] [phone]

## 2023-12-22 NOTE — Patient Instructions (Signed)
Cystoscopy Cystoscopy is a procedure that is used to help diagnose and sometimes treat conditions that affect the lower urinary tract. The lower urinary tract includes the bladder and the urethra. The urethra is the tube that drains urine from the bladder. Cystoscopy is done using a thin, tube-shaped instrument with a light and camera at the end (cystoscope). The cystoscope may be hard or flexible, depending on the goal of the procedure. The cystoscope is inserted through the urethra, into the bladder. Cystoscopy may be recommended if you have: Urinary tract infections that keep coming back. Blood in the urine (hematuria). An inability to control when you urinate (urinary incontinence) or an overactive bladder. Unusual cells found in a urine sample. A blockage in the urethra, such as a urinary stone. Painful urination. An abnormality in the bladder found during an intravenous pyelogram (IVP) or CT scan. What are the risks? Generally, this is a safe procedure. However, problems may occur, including: Infection. Bleeding.  What happens during the procedure?  You will be given one or more of the following: A medicine to numb the area (local anesthetic). The area around the opening of your urethra will be cleaned. The cystoscope will be passed through your urethra into your bladder. Germ-free (sterile) fluid will flow through the cystoscope to fill your bladder. The fluid will stretch your bladder so that your health care provider can clearly examine your bladder walls. Your doctor will look at the urethra and bladder. The cystoscope will be removed The procedure may vary among health care providers  What can I expect after the procedure? After the procedure, it is common to have: Some soreness or pain in your urethra. Urinary symptoms. These include: Mild pain or burning when you urinate. Pain should stop within a few minutes after you urinate. This may last for up to a few days after the  procedure. A small amount of blood in your urine for several days. Feeling like you need to urinate but producing only a small amount of urine. Follow these instructions at home: General instructions Return to your normal activities as told by your health care provider.  Drink plenty of fluids after the procedure. Keep all follow-up visits as told by your health care provider. This is important. Contact a health care provider if you: Have pain that gets worse or does not get better with medicine, especially pain when you urinate lasting longer than 72 hours after the procedure. Have trouble urinating. Get help right away if you: Have blood clots in your urine. Have a fever or chills. Are unable to urinate. Summary Cystoscopy is a procedure that is used to help diagnose and sometimes treat conditions that affect the lower urinary tract. Cystoscopy is done using a thin, tube-shaped instrument with a light and camera at the end. After the procedure, it is common to have some soreness or pain in your urethra. It is normal to have blood in your urine after the procedure.  If you were prescribed an antibiotic medicine, take it as told by your health care provider.  This information is not intended to replace advice given to you by your health care provider. Make sure you discuss any questions you have with your health care provider. Document Revised: 09/20/2018 Document Reviewed: 09/20/2018 Elsevier Patient Education  2020 [employer].  Nocturia refers to the need to wake up during the night to urinate, which can disrupt your sleep and impact your overall well-being. Fortunately, there are several strategies you can employ to  help prevent or manage nocturia. It's important to consult with your healthcare provider before making any significant changes to your routine. Here are some helpful strategies to consider:  Limit Fluid Intake Before Bed: Avoid drinking large amounts of fluids in the  evening, especially within a few hours of bedtime. Consume most of your daily fluid intake earlier in the day to reduce the need to urinate at night.  Monitor Your Diet: Limit your intake of caffeine and alcohol, as these substances can increase urine production and irritate the bladder.  Avoid diet, "zero calorie," and artificially sweetened drinks, especially sodas, in the afternoon or evening. Be mindful of consuming foods and drinks with high water content before bedtime, such as watermelon and herbal teas.  Time Your Medications: If you're taking medications that contribute to increased urination, consult your healthcare provider about adjusting the timing of these medications to minimize their impact during the night.  Practice Double Voiding: Before going to bed, make an effort to empty your bladder twice within a short period. This can help reduce the amount of urine left in your bladder before sleep.  Bladder Training: Gradually increase the time between bathroom visits during the day to train your bladder to hold larger volumes of urine. Over time, this can help reduce the frequency of nighttime awakenings to urinate.  Elevate Your Legs During the Day: Elevating your legs during the day can help minimize fluid retention in your lower extremities, which might reduce nighttime urination.  Pelvic Floor Exercises: Strengthening your pelvic floor muscles through Kegel exercises can help improve bladder control and potentially reduce the urge to urinate at night.  Create a Relaxing Bedtime Routine: Stress and anxiety can exacerbate nocturia. Engage in calming activities before bed, such as reading, listening to soothing music, or practicing relaxation techniques.  Stay Active: Engage in regular physical activity, but avoid intense exercise close to bedtime, as this can increase your body's demand for fluids.  Maintain a Healthy Weight: Excess weight can compress the bladder and  contribute to bladder and urinary issues. Aim to achieve and maintain a healthy weight through a balanced diet and regular exercise.  Remember that every individual is unique, and the effectiveness of these strategies may vary. It's important to work with your healthcare provider to develop a plan that suits your specific needs and addresses any underlying causes of nocturia.

## 2023-12-24 ENCOUNTER — Telehealth: Payer: Self-pay

## 2023-12-24 NOTE — Telephone Encounter (Signed)
 Patient left message on triage line stating that he has issues and needs to speak to someone.  I called to get more details but voicemail is not set up to leave a message

## 2024-01-05 ENCOUNTER — Ambulatory Visit (INDEPENDENT_AMBULATORY_CARE_PROVIDER_SITE_OTHER): Admitting: Urology

## 2024-01-05 VITALS — BP 175/105 | HR 100

## 2024-01-05 DIAGNOSIS — R399 Unspecified symptoms and signs involving the genitourinary system: Secondary | ICD-10-CM

## 2024-01-05 DIAGNOSIS — R350 Frequency of micturition: Secondary | ICD-10-CM | POA: Diagnosis not present

## 2024-01-05 MED ORDER — LIDOCAINE HCL URETHRAL/MUCOSAL 2 % EX GEL
1.0000 | Freq: Once | CUTANEOUS | Status: AC
  Administered 2024-01-05: 1 via URETHRAL

## 2024-01-05 NOTE — Progress Notes (Signed)
 Cystoscopy Procedure Note:  Indication:  Urinary symptoms of nocturia 3 times overnight, urgency, urge incontinence, dysuria.   After informed consent and discussion of the procedure and its risks, Tyler Colon was positioned and prepped in the standard fashion. Cystoscopy was performed with a flexible cystoscope. The urethra, bladder neck and entire bladder was visualized in a standard fashion. The prostate was moderate in size. The ureteral orifices were visualized in their normal location and orientation.  Bladder mucosa grossly normal throughout with no tumors or lesions, very subtle erythema near the trigone and cytology was sent.  Findings: Normal cystoscopy  Assessment and Plan:  -Follow-up cytology-if suspicious would recommend bladder biopsy -Finish course of antibiotics prescribed by PCP-though multiple urinalysis have been benign -Consider alternative diagnosis like pelvic floor dysfunction if persistent symptoms-I had a frank conversation with the patient that I do not have a good explanation for his primary complaint which is tingling in his hands when he urinates  Legrand Rams, MD 01/05/2024

## 2024-01-10 ENCOUNTER — Ambulatory Visit: Admitting: Urology

## 2024-01-19 ENCOUNTER — Telehealth: Payer: Self-pay

## 2024-01-19 NOTE — Telephone Encounter (Signed)
-----   Message from Sondra Come sent at 01/18/2024  4:29 PM EDT ----- Regarding: Cytology results Good news, no evidence of cancer cells on urine cytology, keep follow-up as scheduled  Legrand Rams, MD 01/18/2024 ----- Message ----- From: Annamaria Helling Sent: 01/18/2024   8:33 AM EDT To: Sondra Come, MD

## 2024-01-19 NOTE — Telephone Encounter (Signed)
 See my chart message

## 2024-02-14 ENCOUNTER — Ambulatory Visit: Admitting: Urology

## 2024-03-20 ENCOUNTER — Other Ambulatory Visit: Payer: Self-pay | Admitting: Neurology

## 2024-03-20 DIAGNOSIS — R29898 Other symptoms and signs involving the musculoskeletal system: Secondary | ICD-10-CM

## 2024-03-26 ENCOUNTER — Other Ambulatory Visit

## 2024-03-26 ENCOUNTER — Ambulatory Visit
Admission: RE | Admit: 2024-03-26 | Discharge: 2024-03-26 | Disposition: A | Discharge status: Home / Self Care | Source: Ambulatory Visit | Attending: Neurology | Admitting: Neurology

## 2024-03-26 DIAGNOSIS — R29898 Other symptoms and signs involving the musculoskeletal system: Secondary | ICD-10-CM | POA: Insufficient documentation
